# Patient Record
Sex: Female | Born: 1945 | Race: Black or African American | Hispanic: No | Marital: Married | Smoking: Current every day smoker
Health system: Southern US, Community
[De-identification: ages and names within clinical notes are randomized; demographics above are authoritative.]

## PROBLEM LIST (undated history)

## (undated) DIAGNOSIS — C50919 Malignant neoplasm of unspecified site of unspecified female breast: Secondary | ICD-10-CM

## (undated) DIAGNOSIS — R569 Unspecified convulsions: Secondary | ICD-10-CM

## (undated) DIAGNOSIS — E7800 Pure hypercholesterolemia, unspecified: Secondary | ICD-10-CM

## (undated) DIAGNOSIS — E119 Type 2 diabetes mellitus without complications: Secondary | ICD-10-CM

## (undated) DIAGNOSIS — I10 Essential (primary) hypertension: Secondary | ICD-10-CM

## (undated) HISTORY — PX: BACK SURGERY: SHX140

## (undated) HISTORY — PX: CHOLECYSTECTOMY: SHX55

---

## 2016-07-27 ENCOUNTER — Emergency Department (HOSPITAL_BASED_OUTPATIENT_CLINIC_OR_DEPARTMENT_OTHER): Payer: Medicare Other

## 2016-07-27 ENCOUNTER — Encounter (HOSPITAL_BASED_OUTPATIENT_CLINIC_OR_DEPARTMENT_OTHER): Payer: Self-pay | Admitting: Adult Health

## 2016-07-27 ENCOUNTER — Emergency Department (HOSPITAL_BASED_OUTPATIENT_CLINIC_OR_DEPARTMENT_OTHER)
Admission: EM | Admit: 2016-07-27 | Discharge: 2016-07-27 | Disposition: A | Discharge status: Home / Self Care | Payer: Medicare Other | Attending: Emergency Medicine | Admitting: Emergency Medicine

## 2016-07-27 DIAGNOSIS — Z79899 Other long term (current) drug therapy: Secondary | ICD-10-CM | POA: Diagnosis not present

## 2016-07-27 DIAGNOSIS — Z91040 Latex allergy status: Secondary | ICD-10-CM | POA: Insufficient documentation

## 2016-07-27 DIAGNOSIS — I10 Essential (primary) hypertension: Secondary | ICD-10-CM | POA: Insufficient documentation

## 2016-07-27 DIAGNOSIS — F17200 Nicotine dependence, unspecified, uncomplicated: Secondary | ICD-10-CM | POA: Insufficient documentation

## 2016-07-27 DIAGNOSIS — R55 Syncope and collapse: Secondary | ICD-10-CM

## 2016-07-27 DIAGNOSIS — E876 Hypokalemia: Secondary | ICD-10-CM

## 2016-07-27 DIAGNOSIS — Z853 Personal history of malignant neoplasm of breast: Secondary | ICD-10-CM | POA: Diagnosis not present

## 2016-07-27 HISTORY — DX: Malignant neoplasm of unspecified site of unspecified female breast: C50.919

## 2016-07-27 HISTORY — DX: Essential (primary) hypertension: I10

## 2016-07-27 LAB — CBC
HEMATOCRIT: 37.1 % (ref 36.0–46.0)
Hemoglobin: 12.4 g/dL (ref 12.0–15.0)
MCH: 28.5 pg (ref 26.0–34.0)
MCHC: 33.4 g/dL (ref 30.0–36.0)
MCV: 85.3 fL (ref 78.0–100.0)
PLATELETS: 174 10*3/uL (ref 150–400)
RBC: 4.35 MIL/uL (ref 3.87–5.11)
RDW: 13.4 % (ref 11.5–15.5)
WBC: 5.8 10*3/uL (ref 4.0–10.5)

## 2016-07-27 LAB — BASIC METABOLIC PANEL
Anion gap: 9 (ref 5–15)
BUN: 12 mg/dL (ref 6–20)
CALCIUM: 9.4 mg/dL (ref 8.9–10.3)
CO2: 27 mmol/L (ref 22–32)
CREATININE: 1.04 mg/dL — AB (ref 0.44–1.00)
Chloride: 103 mmol/L (ref 101–111)
GFR calc Af Amer: >60 mL/min (ref >60)
GFR, EST NON AFRICAN AMERICAN: 54 mL/min — AB (ref >60)
Glucose, Bld: 125 mg/dL — ABNORMAL HIGH (ref 65–99)
POTASSIUM: 2.8 mmol/L — AB (ref 3.5–5.1)
SODIUM: 139 mmol/L (ref 135–145)

## 2016-07-27 LAB — CBG MONITORING, ED: GLUCOSE-CAPILLARY: 134 mg/dL — AB (ref 65–99)

## 2016-07-27 MED ORDER — ACETAMINOPHEN 325 MG PO TABS
650.0000 mg | ORAL_TABLET | Freq: Once | ORAL | Status: AC
  Administered 2016-07-27: 650 mg via ORAL
  Filled 2016-07-27: qty 2

## 2016-07-27 MED ORDER — IBUPROFEN 400 MG PO TABS
400.0000 mg | ORAL_TABLET | Freq: Once | ORAL | Status: AC
  Administered 2016-07-27: 400 mg via ORAL
  Filled 2016-07-27: qty 1

## 2016-07-27 MED ORDER — SODIUM CHLORIDE 0.9 % IV BOLUS (SEPSIS)
1000.0000 mL | Freq: Once | INTRAVENOUS | Status: AC
  Administered 2016-07-27: 1000 mL via INTRAVENOUS

## 2016-07-27 MED ORDER — POTASSIUM CHLORIDE CRYS ER 20 MEQ PO TBCR
40.0000 meq | EXTENDED_RELEASE_TABLET | Freq: Once | ORAL | Status: AC
  Administered 2016-07-27: 40 meq via ORAL
  Filled 2016-07-27: qty 2

## 2016-07-27 NOTE — ED Notes (Signed)
Patient transported to CT 

## 2016-07-27 NOTE — ED Notes (Signed)
Patient denies pain and is resting comfortably.  

## 2016-07-27 NOTE — Discharge Instructions (Signed)
Please read and follow all provided instructions.  Your diagnoses today include:  1. Near syncope   2. Hypokalemia    Tests performed today include: Vital signs. See below for your results today.   Medications prescribed:  Take as prescribed   Home care instructions:  Follow any educational materials contained in this packet.  Follow-up instructions: Please follow-up with your primary care provider for further evaluation of symptoms and treatment   Return instructions:  Please return to the Emergency Department if you do not get better, if you get worse, or new symptoms OR  - Fever (temperature greater than 101.20F)  - Bleeding that does not stop with holding pressure to the area    -Severe pain (please note that you may be more sore the day after your accident)  - Chest Pain  - Difficulty breathing  - Severe nausea or vomiting  - Inability to tolerate food and liquids  - Passing out  - Skin becoming red around your wounds  - Change in mental status (confusion or lethargy)  - New numbness or weakness    Please return if you have any other emergent concerns.  Additional Information:  Your vital signs today were: BP 137/60    Pulse 69    Temp 98.6 F (37 C) (Oral)    Resp 17    Ht 5\' 4"  (1.626 m)    Wt 72.6 kg    LMP  (LMP Unknown)    SpO2 97%    BMI 27.46 kg/m  If your blood pressure (BP) was elevated above 135/85 this visit, please have this repeated by your doctor within one month. ---------------

## 2016-07-27 NOTE — ED Notes (Signed)
Pt given d/c instructions as per chart. Verbalizes understanding. No questions. 

## 2016-07-27 NOTE — ED Triage Notes (Signed)
Presents with one week of headache that has been constant. Today while getting ready to cook a meal she states, "I became reall dizzy and not feeling good, not feeling right. My mouth felt numb and funny and felt like I was going to pass out. I felt my BP was low, I went and laid down and checked my BP it was 94/49. I take 6 BP medications a day and I don't think I should be taking the 100 of Atenolol in the morning because the drug store said the doctor took me off it, but I still have refills and they are still giving it to me. I took the 50 mg of Atenolol this AM and then after it happened I took the other 50 mg thinking it was high BP." Denies numbness and tingling at this time, denies trouble speaking, denies one sided weakness or numbness. Everything is resolved at this time. Pt alert. Denies pain. BP now 160/65.

## 2016-07-27 NOTE — ED Provider Notes (Signed)
[facility] Provider Note   CSN: [id number] Arrival date & time: 07/27/16  1315     History   Chief Complaint Chief Complaint  Patient presents with  . Near Syncope    HPI Carolyn Jimenez is a 70 y.o. female.  HPI  70 y.o. female with a hx of HTN, presents to the Emergency Department today due to x 1 week headache that has been constant and unilateral. Pt does note that the headache went away last night and states that this usually occurs. What brought her in today was when she had a near syncopal episode while in the kitchen. Pt states that she was standing and cooking when she felt dizzy and funny. Notes numbness around lips. Pt proceeded to check her BP and noticed that it was 94/49. Pt notes that she takes 6 BP meds a day from PCP. Pt notes that a pharmacist told her that she is not supposed to be taking 100mg  Atenolol and that her PCP switched her to 50mg . Pt unaware of this change. During episode today, pt denied CP/SOB. NO visual changes. No N/V/D. No numbness/tingling. No fevers. Notes no symptoms currently aside from mild headache. No other symptoms noted   Past Medical History:  Diagnosis Date  . Breast cancer ([place])   . Hypertension     There are no active problems to display for this patient.   History reviewed. No pertinent surgical history.  OB History    No data available       Home Medications    Prior to Admission medications   Medication Sig Start Date End Date Taking? Authorizing Provider  amLODipine (NORVASC) 10 MG tablet Take 10 mg by mouth daily.   Yes Historical Provider, MD  atenolol (TENORMIN) 100 MG tablet Take 100 mg by mouth daily.   Yes Historical Provider, MD  atenolol (TENORMIN) 50 MG tablet Take 50 mg by mouth daily.   Yes Historical Provider, MD  benazepril (LOTENSIN) 40 MG tablet Take 40 mg by mouth daily.   Yes Historical Provider, MD  doxazosin (CARDURA) 2 MG tablet Take 2 mg by mouth daily.   Yes Historical Provider, MD    gabapentin (NEURONTIN) 300 MG capsule Take 300 mg by mouth at bedtime.   Yes Historical Provider, MD  omeprazole (PRILOSEC) 40 MG capsule Take 40 mg by mouth daily.   Yes Historical Provider, MD  oxyCODONE-acetaminophen (PERCOCET) 10-325 MG tablet Take 1 tablet by mouth every 4 (four) hours as needed for pain.   Yes Historical Provider, MD  pravastatin (PRAVACHOL) 40 MG tablet Take 40 mg by mouth daily.   Yes Historical Provider, MD  tiZANidine (ZANAFLEX) 4 MG capsule Take 4 mg by mouth 3 (three) times daily.   Yes Historical Provider, MD    Family History History reviewed. No pertinent family history.  Social History Social History  Substance Use Topics  . Smoking status: Current Every Day Smoker  . Smokeless tobacco: Never Used  . Alcohol use No     Allergies   Codeine; Latex; and Sulfa antibiotics   Review of Systems Review of Systems ROS reviewed and all are negative for acute change except as noted in the HPI.  Physical Exam Updated Vital Signs BP 142/62   Pulse 70   Temp 98.6 F (37 C) (Oral)   Resp 14   Ht 5\' 4"  (1.626 m)   Wt 72.6 kg   LMP  (LMP Unknown)   SpO2 96%   BMI 27.46 kg/m  Physical Exam  Constitutional: She is oriented to person, place, and time. Vital signs are normal. She appears well-developed and well-nourished.  HENT:  Head: Normocephalic and atraumatic.  Right Ear: Hearing normal.  Left Ear: Hearing normal.  Eyes: Conjunctivae and EOM are normal. Pupils are equal, round, and reactive to light.  Neck: Normal range of motion. Neck supple.  Cardiovascular: Normal rate, regular rhythm, normal heart sounds and intact distal pulses.   Pulmonary/Chest: Effort normal and breath sounds normal.  Abdominal: Soft.  Musculoskeletal: Normal range of motion.  Neurological: She is alert and oriented to person, place, and time. She has normal strength. No cranial nerve deficit or sensory deficit.  Cranial Nerves:  II: Pupils equal, round, reactive to  light III,IV, VI: ptosis not present, extra-ocular motions intact bilaterally  V,VII: smile symmetric, facial light touch sensation equal VIII: hearing grossly normal bilaterally  IX,X: midline uvula rise  XI: bilateral shoulder shrug equal and strong XII: midline tongue extension Finger to Nose Exam Unremarkable   Skin: Skin is warm and dry.  Psychiatric: She has a normal mood and affect. Her speech is normal and behavior is normal. Thought content normal.  Nursing note and vitals reviewed.  ED Treatments / Results  Labs (all labs ordered are listed, but only abnormal results are displayed) Labs Reviewed  BASIC METABOLIC PANEL - Abnormal; Notable for the following:       Result Value   Potassium 2.8 (*)    Glucose, Bld 125 (*)    Creatinine, Ser 1.04 (*)    GFR calc non Af Amer 54 (*)    All other components within normal limits  CBG MONITORING, ED - Abnormal; Notable for the following:    Glucose-Capillary 134 (*)    All other components within normal limits  CBC  URINALYSIS, ROUTINE W REFLEX MICROSCOPIC (NOT AT [facility])   EKG  EKG Interpretation None      Radiology Ct Head Wo Contrast  Result Date: 07/27/2016 CLINICAL DATA:  Headache for 9 days. EXAM: CT HEAD WITHOUT CONTRAST TECHNIQUE: Contiguous axial images were obtained from the base of the skull through the vertex without intravenous contrast. COMPARISON:  None. FINDINGS: Brain: Ventricles are normal in size and configuration. All areas of the brain demonstrate normal gray-white matter attenuation. There is no mass, hemorrhage, edema or other evidence of acute parenchymal abnormality. No extra-axial hemorrhage. Vascular: There are chronic calcified atherosclerotic changes of the large vessels at the skull base. No unexpected hyperdense vessel. Skull: Normal. Negative for fracture or focal lesion. Sinuses/Orbits: No acute finding. Visualized portions of the upper paranasal sinuses are clear. Mastoid air cells are clear.  Other: None. IMPRESSION: No acute findings.  No intracranial mass, hemorrhage or edema. Electronically Signed   By: Franki Cabot M.D.   On: 07/27/2016 15:26    Procedures Procedures (including critical care time)  Medications Ordered in ED Medications  sodium chloride 0.9 % bolus 1,000 mL (1,000 mLs Intravenous New Bag/Given 07/27/16 1448)  acetaminophen (TYLENOL) tablet 650 mg (650 mg Oral Given 07/27/16 1526)  ibuprofen (ADVIL,MOTRIN) tablet 400 mg (400 mg Oral Given 07/27/16 1526)  potassium chloride SA (K-DUR,KLOR-CON) CR tablet 40 mEq (40 mEq Oral Given 07/27/16 1547)   Initial Impression / Assessment and Plan / ED Course  I have reviewed the triage vital signs and the nursing notes.  Pertinent labs & imaging results that were available during my care of the patient were reviewed by me and considered in my medical decision making (see chart  for details).  Clinical Course    Final Clinical Impressions(s) / ED Diagnoses  {I have reviewed and evaluated the relevant laboratory values. {I have reviewed and evaluated the relevant imaging studies. {I have interpreted the relevant EKG. {I have reviewed the relevant previous healthcare records.  {I obtained HPI from historian.   ED Course:  Assessment: Pt is a 70yF who presents with near syncopal episode this morning. Notes headache in the past week that resolved yesterday. States episode lasted a few minutes and resolved. No head trauma or LOC. No N/V. No CP/SOB. No fevers. Pt does not that her pharmacist informed her that she was supposed to be taking 50mg  Atenolo instead of 100mg . Pt BP this morning was 94 systolic when check after near syncopal episode.. On exam, pt in NAD. Nontoxic/nonseptic appearing. VSS. Afebrile. Lungs CTA. Heart RRR. Abdomen nontender soft. CN evaluated and unremarkable. No symptoms currently. CBC/BMP with mild hypokalemia that was replaced in ED. CT Head unremarkable. EKG unremarkable. Given fluids in ED. Likely  attributed to Atenolol that pt was taking. Counseled patient to take 50mg  Atenolol instead of 100mg  and follow up to PCP for further management. Plan is to DC home with follow up to PCP. Pt agrees with plan. Counseled on recording BP throughout the day for PCP. At time of discharge, Patient is in no acute distress. Vital Signs are stable. Patient is able to ambulate. Patient able to tolerate PO.   Disposition/Plan:  DC Home Additional Verbal discharge instructions given and discussed with patient.  Pt Instructed to f/u with PCP in the next week for evaluation and treatment of symptoms. Return precautions given Pt acknowledges and agrees with plan  Supervising Physician Julianne Rice, MD  Final diagnoses:  Near syncope  Hypokalemia    New Prescriptions New Prescriptions   No medications on file     Shary Decamp, PA-C 07/27/16 1606    Julianne Rice, MD 07/28/16 1402

## 2016-08-08 ENCOUNTER — Emergency Department (HOSPITAL_BASED_OUTPATIENT_CLINIC_OR_DEPARTMENT_OTHER)
Admission: EM | Admit: 2016-08-08 | Discharge: 2016-08-08 | Disposition: A | Discharge status: Home / Self Care | Payer: Medicare Other | Attending: Dermatology | Admitting: Dermatology

## 2016-08-08 ENCOUNTER — Encounter (HOSPITAL_BASED_OUTPATIENT_CLINIC_OR_DEPARTMENT_OTHER): Payer: Self-pay

## 2016-08-08 DIAGNOSIS — Z853 Personal history of malignant neoplasm of breast: Secondary | ICD-10-CM | POA: Diagnosis not present

## 2016-08-08 DIAGNOSIS — F17210 Nicotine dependence, cigarettes, uncomplicated: Secondary | ICD-10-CM | POA: Diagnosis not present

## 2016-08-08 DIAGNOSIS — Z79899 Other long term (current) drug therapy: Secondary | ICD-10-CM | POA: Insufficient documentation

## 2016-08-08 DIAGNOSIS — Z5321 Procedure and treatment not carried out due to patient leaving prior to being seen by health care provider: Secondary | ICD-10-CM | POA: Diagnosis not present

## 2016-08-08 DIAGNOSIS — I959 Hypotension, unspecified: Secondary | ICD-10-CM | POA: Diagnosis not present

## 2016-08-08 NOTE — ED Triage Notes (Signed)
Nurse first-pt seated with family in ED WR-NAD

## 2016-08-08 NOTE — ED Triage Notes (Signed)
Pt not in ED WR 

## 2016-08-08 NOTE — ED Triage Notes (Signed)
Pt c/o cont'd low blood pressure today-seen here for same 11/25-last BP 74/50 at aprox 30 min PTA-NAD-steady gait

## 2018-01-07 ENCOUNTER — Encounter (HOSPITAL_BASED_OUTPATIENT_CLINIC_OR_DEPARTMENT_OTHER): Payer: Self-pay

## 2018-01-07 ENCOUNTER — Other Ambulatory Visit: Payer: Self-pay

## 2018-01-07 ENCOUNTER — Emergency Department (HOSPITAL_BASED_OUTPATIENT_CLINIC_OR_DEPARTMENT_OTHER): Payer: Medicare Other

## 2018-01-07 DIAGNOSIS — Z79899 Other long term (current) drug therapy: Secondary | ICD-10-CM | POA: Insufficient documentation

## 2018-01-07 DIAGNOSIS — Z91040 Latex allergy status: Secondary | ICD-10-CM | POA: Diagnosis not present

## 2018-01-07 DIAGNOSIS — E119 Type 2 diabetes mellitus without complications: Secondary | ICD-10-CM | POA: Insufficient documentation

## 2018-01-07 DIAGNOSIS — I10 Essential (primary) hypertension: Secondary | ICD-10-CM | POA: Insufficient documentation

## 2018-01-07 DIAGNOSIS — I2510 Atherosclerotic heart disease of native coronary artery without angina pectoris: Secondary | ICD-10-CM | POA: Diagnosis not present

## 2018-01-07 DIAGNOSIS — R0789 Other chest pain: Secondary | ICD-10-CM | POA: Diagnosis not present

## 2018-01-07 DIAGNOSIS — R079 Chest pain, unspecified: Secondary | ICD-10-CM | POA: Diagnosis present

## 2018-01-07 DIAGNOSIS — Z853 Personal history of malignant neoplasm of breast: Secondary | ICD-10-CM | POA: Insufficient documentation

## 2018-01-07 DIAGNOSIS — F17210 Nicotine dependence, cigarettes, uncomplicated: Secondary | ICD-10-CM | POA: Diagnosis not present

## 2018-01-07 DIAGNOSIS — Z7982 Long term (current) use of aspirin: Secondary | ICD-10-CM | POA: Diagnosis not present

## 2018-01-07 LAB — CBC
HCT: 33.1 % — ABNORMAL LOW (ref 36.0–46.0)
Hemoglobin: 11.2 g/dL — ABNORMAL LOW (ref 12.0–15.0)
MCH: 27.7 pg (ref 26.0–34.0)
MCHC: 33.8 g/dL (ref 30.0–36.0)
MCV: 81.7 fL (ref 78.0–100.0)
Platelets: 177 10*3/uL (ref 150–400)
RBC: 4.05 MIL/uL (ref 3.87–5.11)
RDW: 12.3 % (ref 11.5–15.5)
WBC: 6 10*3/uL (ref 4.0–10.5)

## 2018-01-07 LAB — BASIC METABOLIC PANEL
Anion gap: 13 (ref 5–15)
BUN: 17 mg/dL (ref 6–20)
CALCIUM: 9.1 mg/dL (ref 8.9–10.3)
CO2: 22 mmol/L (ref 22–32)
CREATININE: 1.11 mg/dL — AB (ref 0.44–1.00)
Chloride: 97 mmol/L — ABNORMAL LOW (ref 101–111)
GFR, EST AFRICAN AMERICAN: 57 mL/min — AB (ref >60)
GFR, EST NON AFRICAN AMERICAN: 49 mL/min — AB (ref >60)
Glucose, Bld: 112 mg/dL — ABNORMAL HIGH (ref 65–99)
Potassium: 3.8 mmol/L (ref 3.5–5.1)
SODIUM: 132 mmol/L — AB (ref 135–145)

## 2018-01-07 LAB — TROPONIN I

## 2018-01-07 NOTE — ED Triage Notes (Signed)
C/o CP x 20 min-NAD-steady gait

## 2018-01-08 ENCOUNTER — Emergency Department (HOSPITAL_BASED_OUTPATIENT_CLINIC_OR_DEPARTMENT_OTHER)
Admission: EM | Admit: 2018-01-08 | Discharge: 2018-01-08 | Disposition: A | Discharge status: Home / Self Care | Payer: Medicare Other | Attending: Emergency Medicine | Admitting: Emergency Medicine

## 2018-01-08 DIAGNOSIS — R0789 Other chest pain: Secondary | ICD-10-CM

## 2018-01-08 HISTORY — DX: Pure hypercholesterolemia, unspecified: E78.00

## 2018-01-08 HISTORY — DX: Unspecified convulsions: R56.9

## 2018-01-08 HISTORY — DX: Type 2 diabetes mellitus without complications: E11.9

## 2018-01-08 LAB — TROPONIN I

## 2018-01-08 MED ORDER — PANTOPRAZOLE SODIUM 40 MG PO TBEC
DELAYED_RELEASE_TABLET | ORAL | Status: AC
  Filled 2018-01-08: qty 1

## 2018-01-08 MED ORDER — GI COCKTAIL ~~LOC~~
30.0000 mL | Freq: Once | ORAL | Status: AC
  Administered 2018-01-08: 30 mL via ORAL
  Filled 2018-01-08: qty 30

## 2018-01-08 NOTE — Discharge Instructions (Signed)
Please avoid NSAIDs such as aspirin (Goody powders), ibuprofen (Motrin, Advil), naproxen (Aleve) as these may worsen your symptoms.  Tylenol 1000 mg every 6 hours is safe to take as long as you have no history of liver problems (heavy alcohol use, cirrhosis, hepatitis).  Please avoid spicy, acidic (citrus fruits, tomato based sauces, salsa), greasy, fatty foods.  Please avoid caffeine and alcohol.     Please continue your Protonix 40 mg twice a day as prescribed.  You may use over-the-counter Maalox as needed for indigestion, heartburn.

## 2018-01-08 NOTE — ED Provider Notes (Signed)
TIME SEEN: 1:06 AM  CHIEF COMPLAINT: Chest pain  HPI: Patient is a 72 year old female with history of hypertension, hyperlipidemia, borderline diabetes, seizures, breast cancer in remission who presents to the emergency department with chest pain.  Patient reports that yesterday afternoon she had a headache and then around 9:30 PM started "feeling real bad".  States she felt clammy, cold and had a choking feeling in her chest.  States she did feel short of breath and lightheaded briefly but this has resolved.  No abdominal pain, nausea or vomiting, diaphoresis.  No fever.  States that she has felt like her heart rate was elevated but did not measure it.  It did not feel irregular.  Recently had a cardiac catheterization in March which she reports was normal.  No history of cardiac stents.  No history of PE or DVT.  No lower extremity swelling or pain.  No documented fevers that she is aware of.  No productive cough.   CTA Chest at [facility] 11/18/17:  CLINICAL DATA:Back and chest pain with elevated D-dimer.  EXAM: CT ANGIOGRAPHY CHEST WITH CONTRAST  TECHNIQUE: Multidetector CT imaging of the chest was performed using the standard protocol during bolus administration of intravenous contrast. Multiplanar CT image reconstructions and MIPs were obtained to evaluate the vascular anatomy.  CONTRAST:100 mL Omnipaque 350  COMPARISON:Chest radiograph 10/25/2017  FINDINGS: Cardiovascular: Contrast injection is sufficient to demonstrate satisfactory opacification of the pulmonary arteries to the segmental level. There is no pulmonary embolus. The main pulmonary artery is within normal limits for size. There is a normal 3-vessel arch branching pattern without evidence of acute aortic syndrome. There is mildaortic atherosclerosis. Heart size is normal, without pericardial effusion.  Mediastinum/Nodes: No mediastinal, hilar or axillary lymphadenopathy. The visualized thyroid and thoracic  esophageal course are unremarkable.  Lungs/Pleura: No pulmonary nodules or masses. No pleural effusion or pneumothorax. No focal airspace consolidation. No focal pleural abnormality.  Upper Abdomen: Contrast bolus timing is not optimized for evaluation of the abdominal organs. Within this limitation, the visualized organs of the upper abdomen are normal.  Musculoskeletal: No chest wall abnormality. No acute or significant osseous findings.  Review of the MIP images confirms the above findings.  IMPRESSION: 1. No pulmonary embolus. 2. No acute abnormality of the chest. 3.Aortic Atherosclerosis (ICD10-I70.0).   Electronically Signed By: Derinda Sis M.D. On: 11/18/2017 21:45  Cath 11/24/17 at [facility]:  Cardiac Diagnostic Report  Demographics  [place] of04/27/1947Height  Name Birth  [id number] Age71 year(s)Weight  Number  [id number] Gender FemaleBSA  Number  [id number] Race Unknown BMI  Number  Room [facility] Date of Study03/25/2019  Number  ReferringDiagnostic Reymundo Poll, Physician   MD, [facility]  Procedure  Procedure Type  Diagnostic procedure: Left Heart Cath , Selective Coronaries, Left  Ventricular Injection, CARDIAC CATH  Complications: No Complications.  Conclusions  Diagnostic Procedure Summary  Normal coronary arteries.  Normal LV function LV ejection fraction is 65-70 %  Diagnostic Procedure Recommendations  Recommend work up for non-cardiac causes of symptoms.  Smoking cessation.  I have reviewed the recent history and physical documentation. I personally  spent 8 minutes continuously monitoring the patient during  the administration  of moderate sedation. Pre and post activities have been reviewed. I was  present for the entire procedure.  Signatures  Electronically signed by Clarene Critchley, MD, FACC(Diagnostic  Physician) on 11/24/2017 12:12  Angiographic findings  Cardiac Arteries and Lesion Findings  LMCA: Normal appearance with 0% stenosis.  LAD: Normal appearance with 0% stenosis.  LCx:  Normal appearance with 0% stenosis.  RCA: Normal appearance with 0% stenosis.   ROS: See HPI Constitutional: no fever  Eyes: no drainage  ENT: no runny nose   Cardiovascular:   chest pain  Resp:  SOB  GI: no vomiting GU: no dysuria Integumentary: no rash  Allergy: no hives  Musculoskeletal: no leg swelling  Neurological: no slurred speech ROS otherwise negative  PAST MEDICAL HISTORY/PAST SURGICAL HISTORY:  Past Medical History:  Diagnosis Date  . Breast cancer ([place])   . Diabetes mellitus [place] complication ([place])   . High cholesterol   . Hypertension   . Seizures ([place])     MEDICATIONS:  Prior to Admission medications   Medication Sig Start Date End Date Taking? Authorizing Provider  amLODipine (NORVASC) 10 MG tablet Take 10 mg by mouth daily.   Yes [provider]  aspirin 81 MG chewable tablet Chew by mouth daily.   Yes [provider]  atenolol (TENORMIN) 50 MG tablet Take 50 mg by mouth daily.   Yes [provider]  benazepril (LOTENSIN) 40 MG tablet Take 40 mg by mouth daily.   Yes [provider]  gabapentin (NEURONTIN) 300 MG capsule Take 300 mg by mouth 3 (three) times daily.    Yes [provider]  levETIRAcetam (KEPPRA) 500 MG tablet Take 500 mg by mouth 2 (two) times daily.   Yes [provider]  metoprolol succinate (TOPROL-XL) 25 MG 24 hr tablet Take 25 mg by mouth daily.   Yes [provider]  pantoprazole (PROTONIX) 40 MG tablet Take 40 mg by mouth 2 (two) times daily.   Yes [provider]   rosuvastatin (CRESTOR) 20 MG tablet Take 20 mg by mouth daily.   Yes [provider]  sertraline (ZOLOFT) 50 MG tablet Take 50 mg by mouth 2 (two) times daily. 1 tablet in the morning and 1/2 tablet at night   Yes [provider]  triamterene-hydrochlorothiazide (DYAZIDE) 37.5-25 MG capsule Take 1 capsule by mouth daily.   Yes [provider]  doxazosin (CARDURA) 2 MG tablet Take 2 mg by mouth daily.    [provider]  omeprazole (PRILOSEC) 40 MG capsule Take 40 mg by mouth daily.    [provider]  oxyCODONE-acetaminophen (PERCOCET) 10-325 MG tablet Take 1 tablet by mouth every 4 (four) hours as needed for pain.    [provider]  pravastatin (PRAVACHOL) 40 MG tablet Take 40 mg by mouth daily.    [provider]    ALLERGIES:  Allergies  Allergen Reactions  . Codeine   . Latex   . Sulfa Antibiotics     SOCIAL HISTORY:  Social History   Tobacco Use  . Smoking status: Current Every Day Smoker    Types: Cigarettes  . Smokeless tobacco: Never Used  Substance Use Topics  . Alcohol use: No    FAMILY HISTORY: No family history on file.  EXAM: BP (!) 164/70 (BP Location: Left Arm)   Pulse (!) 103   Temp 98.6 F (37 C) (Oral)   Resp 20   Ht 5\' 4"  (1.626 m)   Wt 70.3 kg (155 lb)   LMP  (LMP Unknown)   SpO2 100%   BMI 26.61 kg/m  CONSTITUTIONAL: Alert and oriented and responds appropriately to questions. Well-appearing; well-nourished HEAD: Normocephalic EYES: Conjunctivae clear, pupils appear equal, EOMI ENT: normal nose; moist mucous membranes NECK: Supple, no meningismus, no nuchal rigidity, no LAD  CARD: RRR; S1 and S2 appreciated;  no murmurs, no clicks, no rubs, no gallops RESP: Normal chest excursion without splinting or tachypnea; breath sounds clear and equal bilaterally; no wheezes, no rhonchi, no rales, no hypoxia or respiratory distress, speaking full sentences ABD/GI: Normal bowel sounds;  non-distended; soft, non-tender, no rebound, no guarding, no peritoneal signs, no hepatosplenomegaly BACK:  The back appears normal and is non-tender to palpation, there is no CVA tenderness EXT: Normal ROM in all joints; non-tender to palpation; no edema; normal capillary refill; no cyanosis, no calf tenderness or swelling    SKIN: Normal color for age and race; warm; no rash NEURO: Moves all extremities equally PSYCH: The patient's mood and manner are appropriate. Grooming and personal hygiene are appropriate.  MEDICAL DECISION MAKING: Patient here with chest pain.  I have reviewed her records from [facility] and it appears that she had normal cardiac catheterization in March 2019.  Also had normal ejection fraction at that time and a CT angio that showed no sign of pulmonary embolus.  Reports similar symptoms today and feels like she is choking.  She states she wonders if this could be indigestion.  Labs obtained in triage thus far have been unremarkable including one negative troponin and she has a clear chest x-ray.  Her EKG shows no new ischemic abnormality.  Plan is to repeat second troponin given she does have risk factors for ACS although she had a completely clean catheterization recently.  I doubt that this is PE or dissection.  It could be indigestion.  Will give GI cocktail, Protonix.  ED PROGRESS: Patient reports significant relief in symptoms after GI cocktail and Protonix.  Suspect GERD, indigestion.  Second troponin is negative.  I feel she is safe to be discharged home and follow-up with her outpatient primary care provider.  Discussed return precautions.  Patient is comfortable with this plan.   At this time, I do not feel there is any life-threatening condition present. I have reviewed and discussed all results (EKG, imaging, lab, urine as appropriate) and exam findings with patient/family. I have reviewed nursing notes and appropriate previous records.  I feel the patient is  safe to be discharged home without further emergent workup and can continue workup as an outpatient as needed. Discussed usual and customary return precautions. Patient/family verbalize understanding and are comfortable with this plan.  Outpatient follow-up has been provided if needed. All questions have been answered.   EKG Interpretation  Date/Time:  Wednesday Jan 07 2018 23:03:52 EDT Ventricular Rate:  85 PR Interval:  136 QRS Duration: 80 QT Interval:  390 QTC Calculation: 464 R Axis:   27 Text Interpretation:  Normal sinus rhythm Normal ECG Confirmed by Teoman Giraud, Cyril Mourning [phone]) on 01/07/2018 11:11:32 PM         Leyani Gargus, Delice Bison, DO 01/08/18 [id number]

## 2018-03-25 IMAGING — CT CT HEAD W/O CM
3 series · 16 of 47 positions shown, 19 images · non-contrast
Comparison: None.

CLINICAL DATA: Headache for 9 days.

EXAM:
CT HEAD WITHOUT CONTRAST
TECHNIQUE: Contiguous axial images were obtained from the base of the skull
through the vertex without intravenous contrast.

[Series 2: head wo · axial · 0.39mm/px · z∈[+726,+850]mm · 10 of 30 slices shown, 13 images]
[im 3/30  brain]
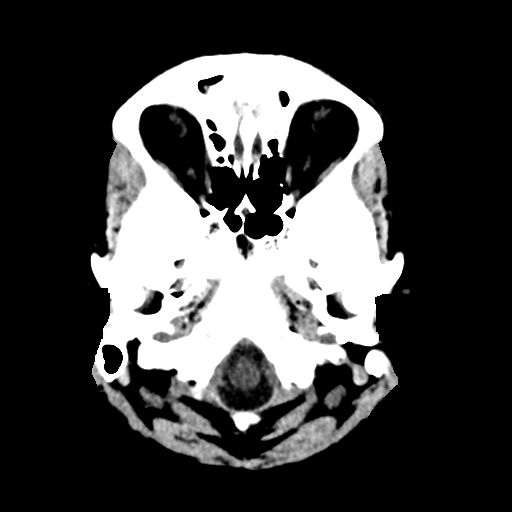
[im 3/30  bone]
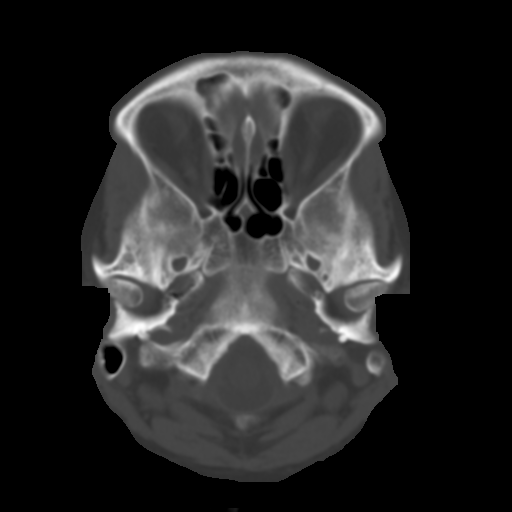
[im 6/30  brain]
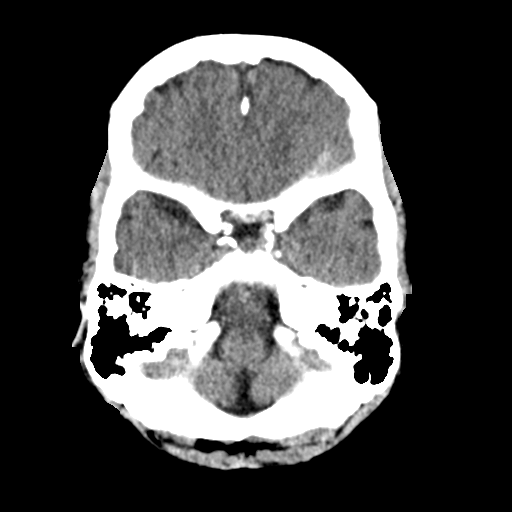
[im 9/30  brain]
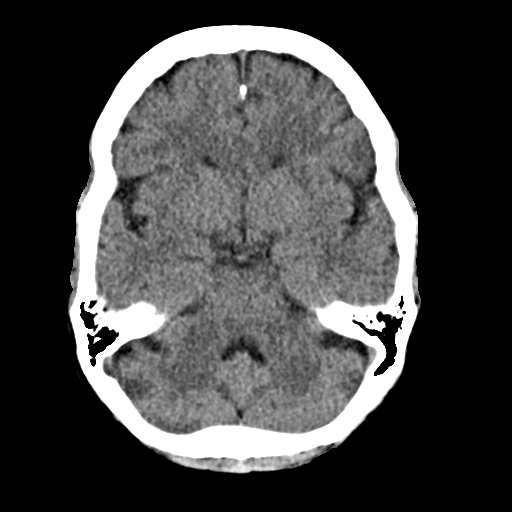
[im 11/30  brain]
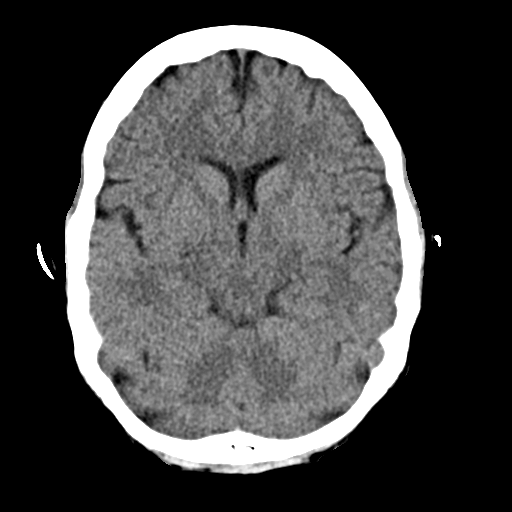
[im 14/30  brain]
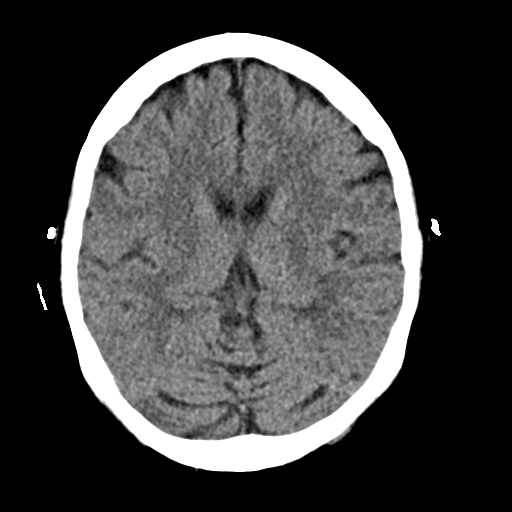
[im 14/30  bone]
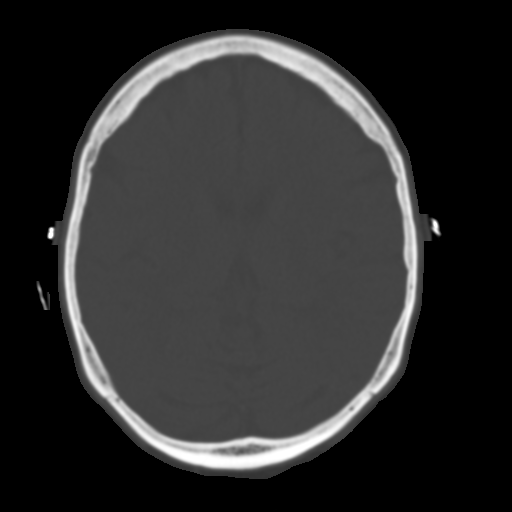
[im 17/30  brain]
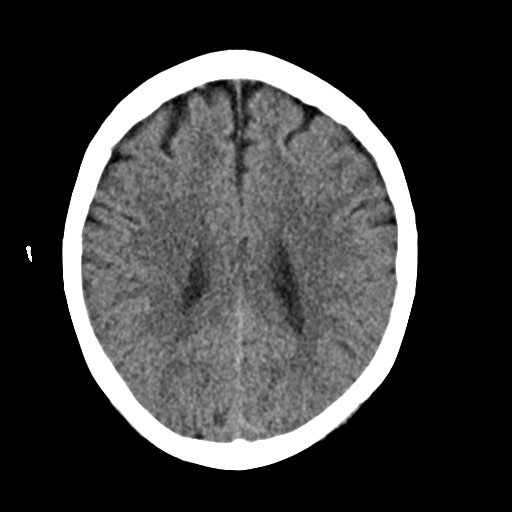
[im 20/30  brain]
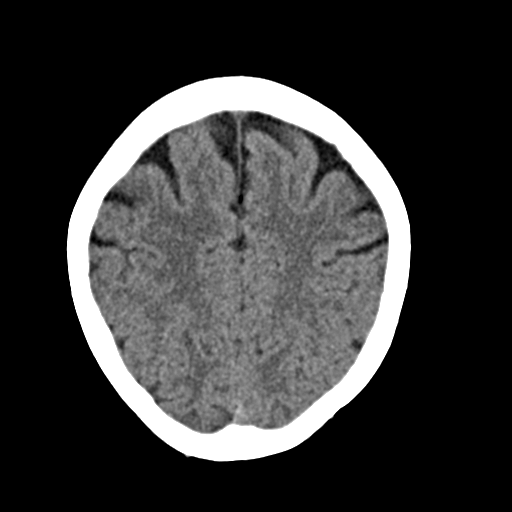
[im 23/30  brain]
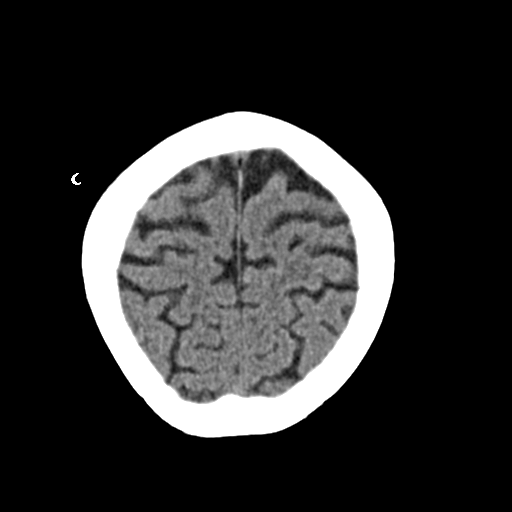
[im 25/30  brain]
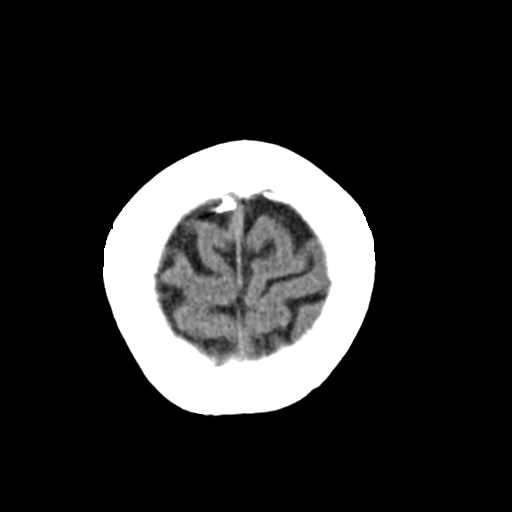
[im 25/30  bone]
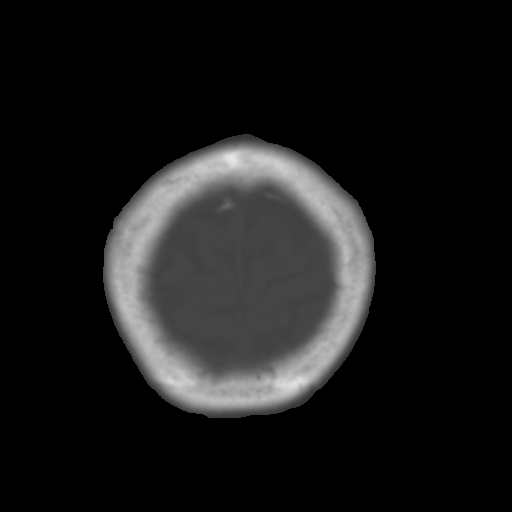
[im 28/30  brain]
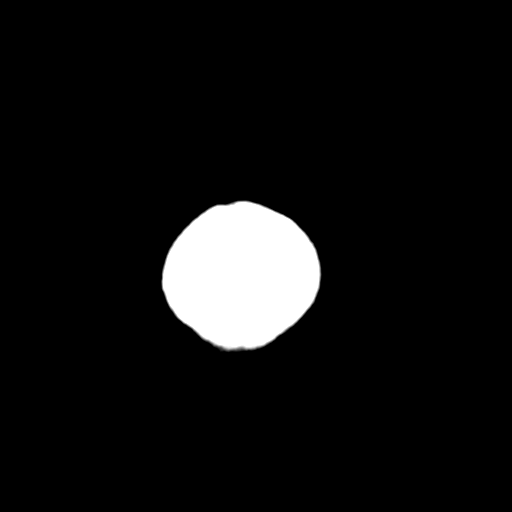

[Series 4: coronal soft · coronal · 0.30mm/px · 3 of 63 slices shown]
[im 21/63  brain]
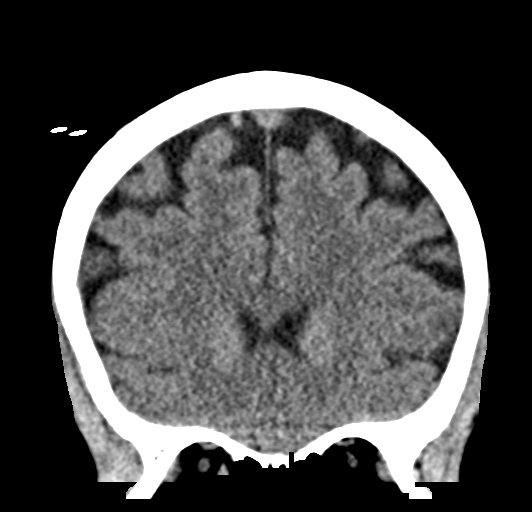
[im 28/63  brain]
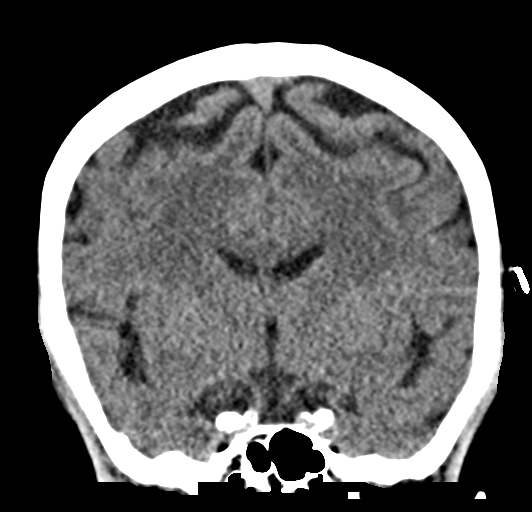
[im 35/63  brain]
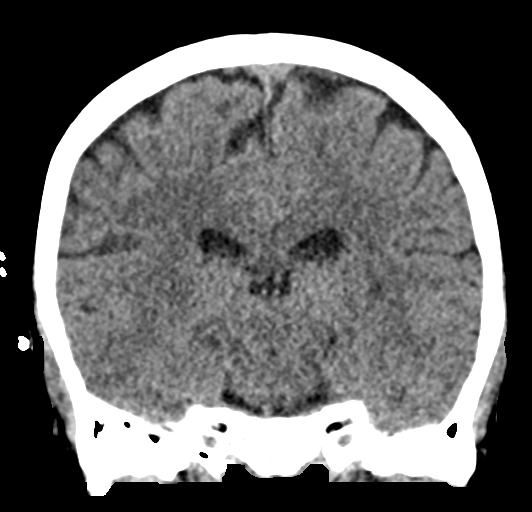

[Series 5: sag soft · sagittal · 0.29mm/px · 3 of 54 slices shown]
[im 18/54  brain]
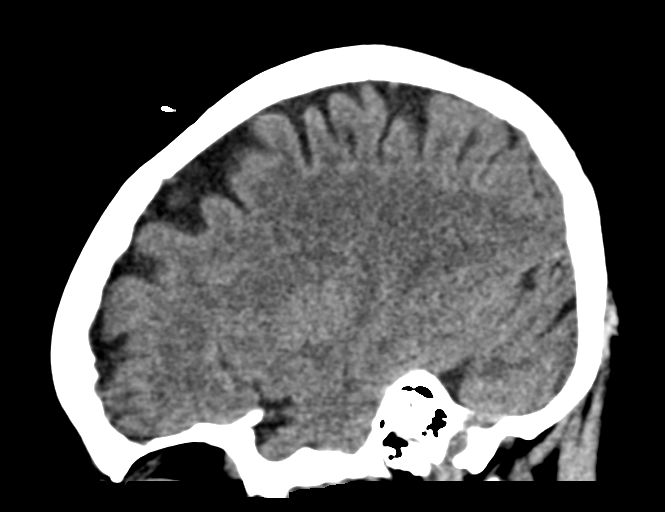
[im 27/54  brain]
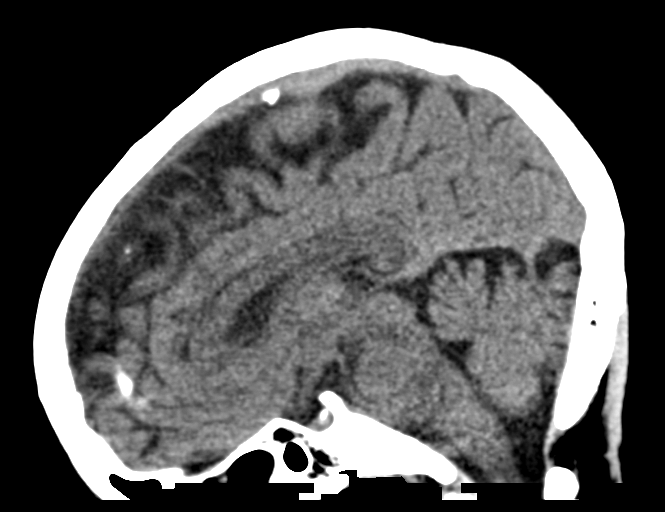
[im 36/54  brain]
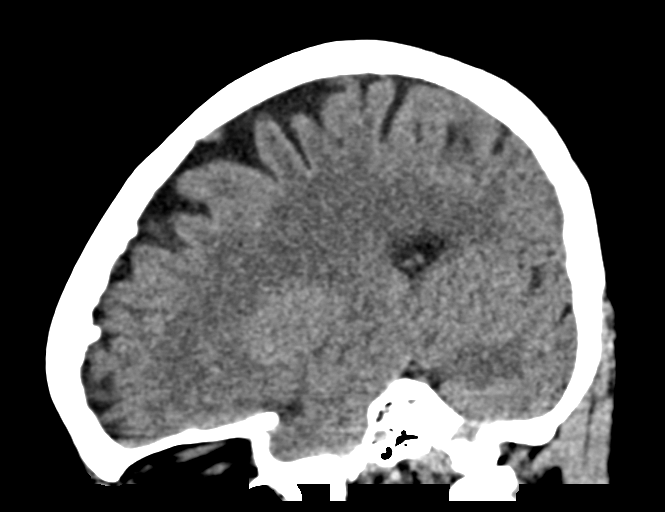

[16 of 47 positions shown; findings below may reference images not displayed]

FINDINGS: Brain: Ventricles are normal in size and configuration. All areas of
the brain demonstrate normal gray-white matter attenuation. There is
no mass, hemorrhage, edema or other evidence of acute parenchymal
abnormality. No extra-axial hemorrhage.

Vascular: There are chronic calcified atherosclerotic changes of the
large vessels at the skull base. No unexpected hyperdense vessel.

Skull: Normal. Negative for fracture or focal lesion.

Sinuses/Orbits: No acute finding. Visualized portions of the upper
paranasal sinuses are clear. Mastoid air cells are clear.

Other: None.
IMPRESSION: No acute findings.  No intracranial mass, hemorrhage or edema.

## 2018-07-11 ENCOUNTER — Emergency Department (HOSPITAL_BASED_OUTPATIENT_CLINIC_OR_DEPARTMENT_OTHER)
Admission: EM | Admit: 2018-07-11 | Discharge: 2018-07-11 | Disposition: A | Discharge status: Home / Self Care | Payer: Medicare Other | Attending: Emergency Medicine | Admitting: Emergency Medicine

## 2018-07-11 ENCOUNTER — Emergency Department (HOSPITAL_BASED_OUTPATIENT_CLINIC_OR_DEPARTMENT_OTHER): Payer: Medicare Other

## 2018-07-11 ENCOUNTER — Other Ambulatory Visit: Payer: Self-pay

## 2018-07-11 ENCOUNTER — Encounter (HOSPITAL_BASED_OUTPATIENT_CLINIC_OR_DEPARTMENT_OTHER): Payer: Self-pay | Admitting: Emergency Medicine

## 2018-07-11 DIAGNOSIS — F17210 Nicotine dependence, cigarettes, uncomplicated: Secondary | ICD-10-CM | POA: Insufficient documentation

## 2018-07-11 DIAGNOSIS — R42 Dizziness and giddiness: Secondary | ICD-10-CM

## 2018-07-11 DIAGNOSIS — R55 Syncope and collapse: Secondary | ICD-10-CM | POA: Insufficient documentation

## 2018-07-11 DIAGNOSIS — I10 Essential (primary) hypertension: Secondary | ICD-10-CM | POA: Diagnosis not present

## 2018-07-11 DIAGNOSIS — K5904 Chronic idiopathic constipation: Secondary | ICD-10-CM | POA: Diagnosis not present

## 2018-07-11 DIAGNOSIS — E119 Type 2 diabetes mellitus without complications: Secondary | ICD-10-CM | POA: Insufficient documentation

## 2018-07-11 DIAGNOSIS — Z79899 Other long term (current) drug therapy: Secondary | ICD-10-CM | POA: Diagnosis not present

## 2018-07-11 LAB — COMPREHENSIVE METABOLIC PANEL
ALK PHOS: 66 U/L (ref 38–126)
ALT: 10 U/L (ref 0–44)
AST: 18 U/L (ref 15–41)
Albumin: 4 g/dL (ref 3.5–5.0)
Anion gap: 13 (ref 5–15)
BUN: 22 mg/dL (ref 8–23)
CALCIUM: 9.7 mg/dL (ref 8.9–10.3)
CO2: 25 mmol/L (ref 22–32)
CREATININE: 1.2 mg/dL — AB (ref 0.44–1.00)
Chloride: 99 mmol/L (ref 98–111)
GFR, EST AFRICAN AMERICAN: 51 mL/min — AB (ref >60)
GFR, EST NON AFRICAN AMERICAN: 44 mL/min — AB (ref >60)
Glucose, Bld: 149 mg/dL — ABNORMAL HIGH (ref 70–99)
Potassium: 3.3 mmol/L — ABNORMAL LOW (ref 3.5–5.1)
Sodium: 137 mmol/L (ref 135–145)
Total Bilirubin: 0.6 mg/dL (ref 0.3–1.2)
Total Protein: 7.4 g/dL (ref 6.5–8.1)

## 2018-07-11 LAB — URINALYSIS, ROUTINE W REFLEX MICROSCOPIC
Bilirubin Urine: NEGATIVE
GLUCOSE, UA: NEGATIVE mg/dL
HGB URINE DIPSTICK: NEGATIVE
Ketones, ur: NEGATIVE mg/dL
Leukocytes, UA: NEGATIVE
Nitrite: NEGATIVE
Protein, ur: NEGATIVE mg/dL
pH: 5.5 (ref 5.0–8.0)

## 2018-07-11 LAB — CBC
HCT: 32.6 % — ABNORMAL LOW (ref 36.0–46.0)
HEMOGLOBIN: 10.2 g/dL — AB (ref 12.0–15.0)
MCH: 26.8 pg (ref 26.0–34.0)
MCHC: 31.3 g/dL (ref 30.0–36.0)
MCV: 85.8 fL (ref 80.0–100.0)
Platelets: 212 10*3/uL (ref 150–400)
RBC: 3.8 MIL/uL — AB (ref 3.87–5.11)
RDW: 13.7 % (ref 11.5–15.5)
WBC: 5.4 10*3/uL (ref 4.0–10.5)
nRBC: 0 % (ref 0.0–0.2)

## 2018-07-11 LAB — TROPONIN I

## 2018-07-11 LAB — LIPASE, BLOOD: Lipase: 36 U/L (ref 11–51)

## 2018-07-11 MED ORDER — ACETAMINOPHEN 325 MG PO TABS
650.0000 mg | ORAL_TABLET | Freq: Once | ORAL | Status: AC
  Administered 2018-07-11: 650 mg via ORAL
  Filled 2018-07-11: qty 2

## 2018-07-11 MED ORDER — SODIUM CHLORIDE 0.9 % IV BOLUS (SEPSIS)
500.0000 mL | Freq: Once | INTRAVENOUS | Status: AC
  Administered 2018-07-11: 500 mL via INTRAVENOUS

## 2018-07-11 MED ORDER — IOPAMIDOL (ISOVUE-300) INJECTION 61%
100.0000 mL | Freq: Once | INTRAVENOUS | Status: AC | PRN
  Administered 2018-07-11: 100 mL via INTRAVENOUS

## 2018-07-11 MED ORDER — SODIUM CHLORIDE 0.9 % IV SOLN
1000.0000 mL | INTRAVENOUS | Status: DC
  Administered 2018-07-11: 1000 mL via INTRAVENOUS

## 2018-07-11 NOTE — ED Notes (Signed)
ED Provider at bedside. 

## 2018-07-11 NOTE — ED Notes (Signed)
Pt states that she had an episode of dizziness and sat down to rest. She states that " later -approx 15 min" she was leaving bedroom and started walking down hall she feels like she fell and hit her head on the wall and fell to the floor. She states that she felt shaky after the fall. Pt states that she is unsure if she experienced loss of consciousness.

## 2018-07-11 NOTE — Discharge Instructions (Signed)
Follow-up with your primary care doctor later this week, return to the emergency room for any recurrent episodes

## 2018-07-11 NOTE — ED Provider Notes (Signed)
[facility] EMERGENCY DEPARTMENT Provider Note   CSN: [id number] Arrival date & time: 07/11/18  1256     History   Chief Complaint Chief Complaint  Patient presents with  . Fall  . Head Injury  . Dizziness    onset at 9am    HPI Carolyn Jimenez is a 72 y.o. female.  HPI Patient presents to the emergency room for evaluation after a possible syncopal episode today.  Patient states earlier this morning she had an episode of lightheadedness when she attempted to stand up from the couch.  She felt lightheaded and woozy and had to sit back down.  The symptoms slowly resolved after about 15 minutes.  Patient was able to get up and she felt like she was fine for may be the next hour or so.  The patient was walking out of the bathroom when she started to feel lightheaded and then she ended up falling hit her head on the wall and landing on the floor.  Patient also has some pain in her right hip.  She denies any vertigo.  She denies any trouble with her speech or vision.  She has no focal weakness.  No chest pain or shortness of breath.  Patient also mentions having some lower abdominal discomfort.  She did have some loose stools.  No vomiting Past Medical History:  Diagnosis Date  . Breast cancer ([place])   . Diabetes mellitus without complication ([place])   . High cholesterol   . Hypertension   . Seizures ([place])     There are no active problems to display for this patient.   Past Surgical History:  Procedure Laterality Date  . BACK SURGERY    . CHOLECYSTECTOMY       OB History   None      Home Medications    Prior to Admission medications   Medication Sig Start Date End Date Taking? Authorizing Provider  amLODipine (NORVASC) 10 MG tablet Take 10 mg by mouth daily.    [provider]  aspirin 81 MG chewable tablet Chew by mouth daily.    [provider]  benazepril (LOTENSIN) 40 MG tablet Take 40 mg by mouth daily.    [provider]    gabapentin (NEURONTIN) 300 MG capsule Take 300 mg by mouth 3 (three) times daily.     [provider]  levETIRAcetam (KEPPRA) 500 MG tablet Take 500 mg by mouth 2 (two) times daily.    [provider]  metoprolol succinate (TOPROL-XL) 25 MG 24 hr tablet Take 25 mg by mouth daily.    [provider]  pantoprazole (PROTONIX) 40 MG tablet Take 40 mg by mouth 2 (two) times daily.    [provider]  rosuvastatin (CRESTOR) 20 MG tablet Take 20 mg by mouth daily.    [provider]  sertraline (ZOLOFT) 50 MG tablet Take 50 mg by mouth 2 (two) times daily. 1 tablet in the morning and 1/2 tablet at night    [provider]  triamterene-hydrochlorothiazide (DYAZIDE) 37.5-25 MG capsule Take 1 capsule by mouth daily.    [provider]    Family History No family history on file.  Social History Social History   Tobacco Use  . Smoking status: Current Every Day Smoker    Types: Cigarettes  . Smokeless tobacco: Never Used  Substance Use Topics  . Alcohol use: No  . Drug use: No     Allergies   Codeine; Latex; and Sulfa  antibiotics   Review of Systems Review of Systems  Cardiovascular: Negative for chest pain.  Gastrointestinal: Negative for blood in stool and vomiting.  Genitourinary: Negative for dysuria.  All other systems reviewed and are negative.    Physical Exam Updated Vital Signs BP (!) 119/54 (BP Location: Left Arm)   Pulse 66   Temp 98 F (36.7 C) (Oral)   Resp 16   Ht 1.626 m (5\' 4" )   Wt 71.7 kg   LMP  (LMP Unknown)   SpO2 100%   BMI 27.12 kg/m   Physical Exam  Constitutional: She is oriented to person, place, and time. She appears well-developed and well-nourished. No distress.  HENT:  Head: Normocephalic and atraumatic.  Right Ear: External ear normal.  Left Ear: External ear normal.  Mouth/Throat: Oropharynx is clear and moist.  Eyes: Conjunctivae are normal. Right eye exhibits no discharge.  Left eye exhibits no discharge. No scleral icterus.  Neck: Neck supple. No tracheal deviation present.  Cardiovascular: Normal rate, regular rhythm and intact distal pulses.  Pulmonary/Chest: Effort normal and breath sounds normal. No stridor. No respiratory distress. She has no wheezes. She has no rales.  Abdominal: Soft. Bowel sounds are normal. She exhibits no distension. There is tenderness in the right lower quadrant. There is no rebound and no guarding.  Musculoskeletal: She exhibits no edema or tenderness.  Neurological: She is alert and oriented to person, place, and time. She has normal strength. No cranial nerve deficit (no facial droop, extraocular movements intact, no slurred speech) or sensory deficit. She exhibits normal muscle tone. She displays no seizure activity. Coordination normal.  No pronator drift bilateral upper extrem, able to hold both legs off bed for 5 seconds, sensation intact in all extremities, no visual field cuts, no left or right sided neglect, normal finger-nose exam bilaterally, no nystagmus noted   Skin: Skin is warm and dry. No rash noted.  Psychiatric: She has a normal mood and affect.  Nursing note and vitals reviewed.    ED Treatments / Results  Labs (all labs ordered are listed, but only abnormal results are displayed) Labs Reviewed  CBC - Abnormal; Notable for the following components:      Result Value   RBC 3.80 (*)    Hemoglobin 10.2 (*)    HCT 32.6 (*)    All other components within normal limits  COMPREHENSIVE METABOLIC PANEL - Abnormal; Notable for the following components:   Potassium 3.3 (*)    Glucose, Bld 149 (*)    Creatinine, Ser 1.20 (*)    GFR calc non Af Amer 44 (*)    GFR calc Af Amer 51 (*)    All other components within normal limits  LIPASE, BLOOD  TROPONIN I  URINALYSIS, ROUTINE W REFLEX MICROSCOPIC    EKG EKG Interpretation  Date/Time:  Saturday July 11 2018 13:13:16 EST Ventricular Rate:  98 PR  Interval:  138 QRS Duration: 82 QT Interval:  426 QTC Calculation: 543 R Axis:   -6 Text Interpretation:  Normal sinus rhythm Nonspecific T wave abnormality Abnormal ECG No significant change since last tracing Confirmed by Dorie Rank [id number]) on 07/11/2018 1:16:13 PM   Radiology Dg Chest 2 View  Result Date: 07/11/2018 CLINICAL DATA:  Fall today.  Hip pain. EXAM: CHEST - 2 VIEW COMPARISON:  01/07/2018 FINDINGS: Cardiac silhouette is borderline enlarged. No mediastinal or hilar masses. No evidence of adenopathy. Clear lungs.  No pleural effusion or pneumothorax. Skeletal structures are unremarkable. IMPRESSION: No active  cardiopulmonary disease. Electronically Signed   By: Lajean Manes M.D.   On: 07/11/2018 15:31   Ct Head Wo Contrast  Result Date: 07/11/2018 CLINICAL DATA:  Patient status post fall.  Headache. EXAM: CT HEAD WITHOUT CONTRAST TECHNIQUE: Contiguous axial images were obtained from the base of the skull through the vertex without intravenous contrast. COMPARISON:  Brain CT 12/07/2017 FINDINGS: Brain: Ventricles and sulci are appropriate for patient's age. No evidence for acute cortically based infarct, intracranial hemorrhage, mass lesion or mass-effect. Vascular: Unremarkable Skull: Intact. Sinuses/Orbits: Paranasal sinuses are well aerated. Mastoid air cells unremarkable. Other: None. IMPRESSION: No acute intracranial process. Electronically Signed   By: Lovey Newcomer M.D.   On: 07/11/2018 15:19   Ct Abdomen Pelvis W Contrast  Result Date: 07/11/2018 CLINICAL DATA:  72 year old female with history of chronic constipation. EXAM: CT ABDOMEN AND PELVIS WITH CONTRAST TECHNIQUE: Multidetector CT imaging of the abdomen and pelvis was performed using the standard protocol following bolus administration of intravenous contrast. CONTRAST:  15mL ISOVUE-300 IOPAMIDOL (ISOVUE-300) INJECTION 61% COMPARISON:  CT the abdomen and pelvis 06/24/2017. FINDINGS: Lower chest: Mild scarring in the  periphery of the right middle lobe. Aortic atherosclerosis. Hepatobiliary: No suspicious cystic or solid hepatic lesions. No intra or extrahepatic biliary ductal dilatation. Status post cholecystectomy. Pancreas: No pancreatic mass. No pancreatic ductal dilatation. No pancreatic or peripancreatic fluid or inflammatory changes. Spleen: Unremarkable. Adrenals/Urinary Tract: Bilateral kidneys and bilateral adrenal glands are normal in appearance. No hydroureteronephrosis. Urinary bladder is normal in appearance. Stomach/Bowel: Normal appearance of the stomach. No pathologic dilatation of small bowel or colon. A few scattered colonic diverticulae are noted, without surrounding inflammatory changes to suggest an acute diverticulitis at this time. Stool burden does not appear excessive. The appendix is not confidently identified and may be surgically absent. Regardless, there are no inflammatory changes noted adjacent to the cecum to suggest the presence of an acute appendicitis at this time. Vascular/Lymphatic: Aortic atherosclerosis, without evidence of aneurysm or dissection in the abdominal or pelvic vasculature. 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 No lymphadenopathy noted in the abdomen or pelvis. Reproductive: Status post hysterectomy. Ovaries are not confidently identified may be surgically absent or atrophic. Other: No significant volume of ascites.  No pneumoperitoneum. Musculoskeletal: Status post PLIF from L4-S1 with interbody grafts at L4-L5 and L5-S1. There are no aggressive appearing lytic or blastic lesions noted in the visualized portions of the skeleton. IMPRESSION: 1. No acute findings are noted in the abdomen or pelvis. 2. Stool burden does not appear excessive. 3. Mild colonic diverticulosis without evidence of acute diverticulitis at this time. 4. Aortic atherosclerosis. Electronically Signed   By: Vinnie Langton M.D.   On: 07/11/2018 15:34   Dg Hip Unilat   With Pelvis 2-3 Views Right  Result Date: 07/11/2018 CLINICAL DATA:  Fall today.  Posterolateral right hip pain. EXAM: DG HIP (WITH OR WITHOUT PELVIS) 2-3V RIGHT COMPARISON:  None. FINDINGS: No fracture or bone lesion. There is acetabular over coverage of both hip joints. Both hip joints are normally aligned. The SI joints and pubic symphysis are normally aligned. There has been a previous posterior lumbar spine fusion, incompletely imaged. Soft tissues are unremarkable. IMPRESSION: No fracture or acute finding. Electronically Signed   By: Lajean Manes M.D.   On: 07/11/2018 15:30    Procedures Procedures (including critical care time)  Medications Ordered in ED Medications  sodium chloride 0.9 % bolus 500 mL (0 mLs Intravenous Stopped 07/11/18 1447)    Followed  by  0.9 %  sodium chloride infusion (1,000 mLs Intravenous New Bag/Given 07/11/18 1515)  acetaminophen (TYLENOL) tablet 650 mg (650 mg Oral Given 07/11/18 1415)  iopamidol (ISOVUE-300) 61 % injection 100 mL (100 mLs Intravenous Contrast Given 07/11/18 1448)     Initial Impression / Assessment and Plan / ED Course  I have reviewed the triage vital signs and the nursing notes.  Pertinent labs & imaging results that were available during my care of the patient were reviewed by me and considered in my medical decision making (see chart for details).   Pt presented with an episode of lightheadedness.  Symptoms were suggestive of an orthostatic episode.  In the emergency room the patient was feeling fine.  She was able to walk around without difficulty.  No findings to suggest acute stroke or TIA.  ED work-up is reassuring.  Head CT is negative.  Abdominal pelvic CT was performed because of her abdominal discomfort and that was normal as well.  Laboratory tests do show mild anemia but the patient is not having any bleeding and this is only slightly lower than previous values.  I doubt this is contributory.  UA is pending.  Anticipate  dc  Final Clinical Impressions(s) / ED Diagnoses   Final diagnoses:  Near syncope    ED Discharge Orders    None       Dorie Rank, MD 07/11/18 1550

## 2018-07-11 NOTE — ED Notes (Signed)
Pt states "I feel better" Denys any dizziness while walking to bathroom. In NAD

## 2018-07-11 NOTE — ED Triage Notes (Signed)
Pt brought in by family for fall about 20 minutes PTA. Pt hit head on corner of wall. Pt also reports she landed on her right hip. Pt thinks her right knee may have given out on her, but pt also reports that she had dizziness at 9 am this morning.

## 2018-07-11 NOTE — ED Notes (Signed)
Patient transported to CT 

## 2018-08-07 ENCOUNTER — Emergency Department (HOSPITAL_BASED_OUTPATIENT_CLINIC_OR_DEPARTMENT_OTHER)
Admission: EM | Admit: 2018-08-07 | Discharge: 2018-08-08 | Disposition: A | Discharge status: Home / Self Care | Payer: Medicare Other | Attending: Emergency Medicine | Admitting: Emergency Medicine

## 2018-08-07 ENCOUNTER — Encounter (HOSPITAL_BASED_OUTPATIENT_CLINIC_OR_DEPARTMENT_OTHER): Payer: Self-pay

## 2018-08-07 ENCOUNTER — Emergency Department (HOSPITAL_BASED_OUTPATIENT_CLINIC_OR_DEPARTMENT_OTHER): Payer: Medicare Other

## 2018-08-07 ENCOUNTER — Other Ambulatory Visit: Payer: Self-pay

## 2018-08-07 DIAGNOSIS — E119 Type 2 diabetes mellitus without complications: Secondary | ICD-10-CM | POA: Insufficient documentation

## 2018-08-07 DIAGNOSIS — F17210 Nicotine dependence, cigarettes, uncomplicated: Secondary | ICD-10-CM | POA: Diagnosis not present

## 2018-08-07 DIAGNOSIS — I10 Essential (primary) hypertension: Secondary | ICD-10-CM | POA: Diagnosis not present

## 2018-08-07 DIAGNOSIS — K529 Noninfective gastroenteritis and colitis, unspecified: Secondary | ICD-10-CM | POA: Diagnosis not present

## 2018-08-07 DIAGNOSIS — R109 Unspecified abdominal pain: Secondary | ICD-10-CM | POA: Diagnosis present

## 2018-08-07 DIAGNOSIS — Z79899 Other long term (current) drug therapy: Secondary | ICD-10-CM | POA: Diagnosis not present

## 2018-08-07 DIAGNOSIS — E785 Hyperlipidemia, unspecified: Secondary | ICD-10-CM | POA: Insufficient documentation

## 2018-08-07 LAB — URINALYSIS, ROUTINE W REFLEX MICROSCOPIC
BILIRUBIN URINE: NEGATIVE
Glucose, UA: NEGATIVE mg/dL
HGB URINE DIPSTICK: NEGATIVE
Ketones, ur: NEGATIVE mg/dL
Nitrite: NEGATIVE
PROTEIN: NEGATIVE mg/dL
Specific Gravity, Urine: 1.02 (ref 1.005–1.030)
pH: 5.5 (ref 5.0–8.0)

## 2018-08-07 LAB — COMPREHENSIVE METABOLIC PANEL
ALT: 12 U/L (ref 0–44)
ANION GAP: 10 (ref 5–15)
AST: 16 U/L (ref 15–41)
Albumin: 4.2 g/dL (ref 3.5–5.0)
Alkaline Phosphatase: 65 U/L (ref 38–126)
BUN: 21 mg/dL (ref 8–23)
CO2: 22 mmol/L (ref 22–32)
Calcium: 9.7 mg/dL (ref 8.9–10.3)
Chloride: 101 mmol/L (ref 98–111)
Creatinine, Ser: 1.2 mg/dL — ABNORMAL HIGH (ref 0.44–1.00)
GFR, EST AFRICAN AMERICAN: 53 mL/min — AB (ref >60)
GFR, EST NON AFRICAN AMERICAN: 45 mL/min — AB (ref >60)
Glucose, Bld: 118 mg/dL — ABNORMAL HIGH (ref 70–99)
POTASSIUM: 3.4 mmol/L — AB (ref 3.5–5.1)
Sodium: 133 mmol/L — ABNORMAL LOW (ref 135–145)
TOTAL PROTEIN: 8.3 g/dL — AB (ref 6.5–8.1)
Total Bilirubin: 0.7 mg/dL (ref 0.3–1.2)

## 2018-08-07 LAB — URINALYSIS, MICROSCOPIC (REFLEX): RBC / HPF: NONE SEEN RBC/hpf (ref 0–5)

## 2018-08-07 LAB — CBC
HCT: 33.6 % — ABNORMAL LOW (ref 36.0–46.0)
Hemoglobin: 10.5 g/dL — ABNORMAL LOW (ref 12.0–15.0)
MCH: 26.7 pg (ref 26.0–34.0)
MCHC: 31.3 g/dL (ref 30.0–36.0)
MCV: 85.5 fL (ref 80.0–100.0)
PLATELETS: 202 10*3/uL (ref 150–400)
RBC: 3.93 MIL/uL (ref 3.87–5.11)
RDW: 14 % (ref 11.5–15.5)
WBC: 8.4 10*3/uL (ref 4.0–10.5)
nRBC: 0 % (ref 0.0–0.2)

## 2018-08-07 LAB — LIPASE, BLOOD: Lipase: 33 U/L (ref 11–51)

## 2018-08-07 MED ORDER — ACETAMINOPHEN 325 MG PO TABS
650.0000 mg | ORAL_TABLET | Freq: Once | ORAL | Status: AC
  Administered 2018-08-07: 650 mg via ORAL
  Filled 2018-08-07: qty 2

## 2018-08-07 MED ORDER — SUCRALFATE 1 G PO TABS
1.0000 g | ORAL_TABLET | Freq: Once | ORAL | Status: AC
  Administered 2018-08-07: 1 g via ORAL
  Filled 2018-08-07: qty 1

## 2018-08-07 MED ORDER — ONDANSETRON HCL 4 MG/2ML IJ SOLN
4.0000 mg | Freq: Once | INTRAMUSCULAR | Status: AC | PRN
  Administered 2018-08-07: 4 mg via INTRAVENOUS
  Filled 2018-08-07: qty 2

## 2018-08-07 MED ORDER — SODIUM CHLORIDE 0.9 % IV BOLUS
500.0000 mL | Freq: Once | INTRAVENOUS | Status: AC
  Administered 2018-08-07: 500 mL via INTRAVENOUS

## 2018-08-07 NOTE — ED Triage Notes (Signed)
C/o abd pain, n/v/d x 2-3 days-entered triage in w/c-groaning

## 2018-08-08 MED ORDER — IOPAMIDOL (ISOVUE-300) INJECTION 61%
75.0000 mL | Freq: Once | INTRAVENOUS | Status: AC | PRN
  Administered 2018-08-08: 75 mL via INTRAVENOUS

## 2018-08-08 MED ORDER — ONDANSETRON 8 MG PO TBDP: 8.0000 mg | ORAL_TABLET | Freq: Three times a day (TID) | ORAL | 0 refills | Status: DC | PRN

## 2018-08-08 MED ORDER — LOPERAMIDE HCL 2 MG PO CAPS: 4.0000 mg | ORAL_CAPSULE | Freq: Every evening | ORAL | 0 refills | Status: DC | PRN

## 2018-08-08 NOTE — Discharge Instructions (Addendum)
He was seen in the ER for diarrhea, vomiting. CT scan does not reveal any concerning finding.  It appears that you have gastroenteritis and he should get better on your own.  We recommend that you taking clear liquid diet for the next 2 days. Please take the nausea and diarrhea medication as prescribed.  Please return to the ER if your symptoms worsen; you have increased pain, fevers, chills, inability to keep any medications down, confusion. Otherwise see the outpatient doctor as requested.

## 2018-08-08 NOTE — ED Provider Notes (Signed)
[facility] EMERGENCY DEPARTMENT Provider Note   CSN: [id number] Arrival date & time: 08/07/18  1858     History   Chief Complaint Chief Complaint  Patient presents with  . Abdominal Pain    HPI Carolyn Jimenez is a 72 y.o. female.  HPI  72 year old female with history of hypertension, hyperlipidemia, diabetes comes in with chief complaint of abdominal pain.  Patient started having abdominal pain with nausea, vomiting and diarrhea 2 or 3 days ago.  Her pain is located over the lower quadrants, it is worse on the left side.  Pain is severe which prompted patient to come to the ER.  She has had about 5 episodes of vomiting today and 2-3 episodes of loose bowel movement.  There has not been any bloody or bilious emesis.  Past Medical History:  Diagnosis Date  . Breast cancer ([place])   . Diabetes mellitus without complication ([place])   . High cholesterol   . Hypertension   . Seizures ([place])     There are no active problems to display for this patient.   Past Surgical History:  Procedure Laterality Date  . BACK SURGERY    . CHOLECYSTECTOMY       OB History   None      Home Medications    Prior to Admission medications   Medication Sig Start Date End Date Taking? Authorizing Provider  amLODipine (NORVASC) 10 MG tablet Take 10 mg by mouth daily.    [provider]  aspirin 81 MG chewable tablet Chew by mouth daily.    [provider]  benazepril (LOTENSIN) 40 MG tablet Take 40 mg by mouth daily.    [provider]  gabapentin (NEURONTIN) 300 MG capsule Take 300 mg by mouth 3 (three) times daily.     [provider]  levETIRAcetam (KEPPRA) 500 MG tablet Take 500 mg by mouth 2 (two) times daily.    [provider]  metoprolol succinate (TOPROL-XL) 25 MG 24 hr tablet Take 25 mg by mouth daily.    [provider]  pantoprazole (PROTONIX) 40 MG tablet Take 40 mg by mouth 2 (two) times daily.    [provider]  rosuvastatin (CRESTOR) 20 MG tablet Take 20 mg by mouth daily.    [provider]  sertraline (ZOLOFT) 50 MG tablet Take 50 mg by mouth 2 (two) times daily. 1 tablet in the morning and 1/2 tablet at night    [provider]  triamterene-hydrochlorothiazide (DYAZIDE) 37.5-25 MG capsule Take 1 capsule by mouth daily.    [provider]    Family History No family history on file.  Social History Social History   Tobacco Use  . Smoking status: Current Every Day Smoker    Types: Cigarettes  . Smokeless tobacco: Never Used  Substance Use Topics  . Alcohol use: No  . Drug use: No     Allergies   Codeine; Latex; and Sulfa antibiotics   Review of Systems Review of Systems  Constitutional: Positive for activity change.  Gastrointestinal: Positive for abdominal pain, diarrhea, nausea and vomiting.  All other systems reviewed and are negative.    Physical Exam Updated Vital Signs BP (!) 138/57   Pulse 63   Temp 98 F (36.7 C) (Oral)   Resp 20   Ht 5\' 4"  (1.626 m)   Wt 71.2 kg   LMP  (LMP Unknown)   SpO2 96%   BMI 26.95 kg/m   Physical Exam  Constitutional: She is oriented to person, place, and time. She appears well-developed.  HENT:  Head: Normocephalic and atraumatic.  Eyes: EOM are normal.  Neck: Normal range of motion. Neck supple.  Cardiovascular: Normal rate.  Pulmonary/Chest: Effort normal.  Abdominal: Bowel sounds are normal. There is tenderness in the right lower quadrant, suprapubic area and left lower quadrant. There is guarding. There is no rebound.  Neurological: She is alert and oriented to person, place, and time.  Skin: Skin is warm and dry.  Nursing note and vitals reviewed.    ED Treatments / Results  Labs (all labs ordered are listed, but only abnormal results are displayed) Labs Reviewed  COMPREHENSIVE METABOLIC PANEL - Abnormal; Notable for the following components:      Result Value   Sodium 133  (*)    Potassium 3.4 (*)    Glucose, Bld 118 (*)    Creatinine, Ser 1.20 (*)    Total Protein 8.3 (*)    GFR calc non Af Amer 45 (*)    GFR calc Af Amer 53 (*)    All other components within normal limits  CBC - Abnormal; Notable for the following components:   Hemoglobin 10.5 (*)    HCT 33.6 (*)    All other components within normal limits  URINALYSIS, ROUTINE W REFLEX MICROSCOPIC - Abnormal; Notable for the following components:   Leukocytes, UA TRACE (*)    All other components within normal limits  URINALYSIS, MICROSCOPIC (REFLEX) - Abnormal; Notable for the following components:   Bacteria, UA FEW (*)    All other components within normal limits  LIPASE, BLOOD    EKG None  Radiology No results found.  Procedures Procedures (including critical care time)  Medications Ordered in ED Medications  ondansetron (ZOFRAN) injection 4 mg (4 mg Intravenous Given 08/07/18 2150)  sucralfate (CARAFATE) tablet 1 g (1 g Oral Given 08/07/18 2215)  sodium chloride 0.9 % bolus 500 mL (500 mLs Intravenous New Bag/Given 08/07/18 2326)  acetaminophen (TYLENOL) tablet 650 mg (650 mg Oral Given 08/07/18 2322)  iopamidol (ISOVUE-300) 61 % injection 75 mL (75 mLs Intravenous Contrast Given 08/08/18 0003)     Initial Impression / Assessment and Plan / ED Course  I have reviewed the triage vital signs and the nursing notes.  Pertinent labs & imaging results that were available during my care of the patient were reviewed by me and considered in my medical decision making (see chart for details).     72 year old female comes in with chief complaint of abdominal pain.  She has history of diabetes, hypertension and hyperlipidemia.  On exam she has lower quadrant tenderness with guarding.  She is also having nausea, vomiting and diarrhea.  Her labs are reassuring.  UA appears to be clean and patient has no UTI-like symptoms.  Upon reassessment, patient reports that her pain has gotten worse.   Therefore we will proceed with CT abdomen and pelvis to ensure there is no perforation or diverticulitis.  Final Clinical Impressions(s) / ED Diagnoses   Final diagnoses:  None    ED Discharge Orders    None       Varney Biles, MD 08/08/18 0106

## 2019-03-26 ENCOUNTER — Other Ambulatory Visit: Payer: Self-pay

## 2019-03-26 ENCOUNTER — Encounter (HOSPITAL_BASED_OUTPATIENT_CLINIC_OR_DEPARTMENT_OTHER): Payer: Self-pay | Admitting: *Deleted

## 2019-03-26 ENCOUNTER — Emergency Department (HOSPITAL_BASED_OUTPATIENT_CLINIC_OR_DEPARTMENT_OTHER)
Admission: EM | Admit: 2019-03-26 | Discharge: 2019-03-26 | Disposition: A | Discharge status: Home / Self Care | Payer: Medicare Other | Attending: Emergency Medicine | Admitting: Emergency Medicine

## 2019-03-26 ENCOUNTER — Emergency Department (HOSPITAL_BASED_OUTPATIENT_CLINIC_OR_DEPARTMENT_OTHER): Payer: Medicare Other

## 2019-03-26 DIAGNOSIS — Z7982 Long term (current) use of aspirin: Secondary | ICD-10-CM | POA: Insufficient documentation

## 2019-03-26 DIAGNOSIS — Z79899 Other long term (current) drug therapy: Secondary | ICD-10-CM | POA: Diagnosis not present

## 2019-03-26 DIAGNOSIS — I10 Essential (primary) hypertension: Secondary | ICD-10-CM | POA: Insufficient documentation

## 2019-03-26 DIAGNOSIS — Z853 Personal history of malignant neoplasm of breast: Secondary | ICD-10-CM | POA: Diagnosis not present

## 2019-03-26 DIAGNOSIS — R197 Diarrhea, unspecified: Secondary | ICD-10-CM | POA: Insufficient documentation

## 2019-03-26 DIAGNOSIS — R1013 Epigastric pain: Secondary | ICD-10-CM | POA: Insufficient documentation

## 2019-03-26 DIAGNOSIS — Z91040 Latex allergy status: Secondary | ICD-10-CM | POA: Diagnosis not present

## 2019-03-26 DIAGNOSIS — E119 Type 2 diabetes mellitus without complications: Secondary | ICD-10-CM | POA: Diagnosis not present

## 2019-03-26 DIAGNOSIS — F17210 Nicotine dependence, cigarettes, uncomplicated: Secondary | ICD-10-CM | POA: Diagnosis not present

## 2019-03-26 LAB — COMPREHENSIVE METABOLIC PANEL
ALT: 16 U/L (ref 0–44)
AST: 22 U/L (ref 15–41)
Albumin: 4.1 g/dL (ref 3.5–5.0)
Alkaline Phosphatase: 90 U/L (ref 38–126)
Anion gap: 10 (ref 5–15)
BUN: 19 mg/dL (ref 8–23)
CO2: 22 mmol/L (ref 22–32)
Calcium: 8.9 mg/dL (ref 8.9–10.3)
Chloride: 103 mmol/L (ref 98–111)
Creatinine, Ser: 1.1 mg/dL — ABNORMAL HIGH (ref 0.44–1.00)
GFR calc Af Amer: 58 mL/min — ABNORMAL LOW (ref >60)
GFR calc non Af Amer: 50 mL/min — ABNORMAL LOW (ref >60)
Glucose, Bld: 98 mg/dL (ref 70–99)
Potassium: 4.3 mmol/L (ref 3.5–5.1)
Sodium: 135 mmol/L (ref 135–145)
Total Bilirubin: 0.7 mg/dL (ref 0.3–1.2)
Total Protein: 7.8 g/dL (ref 6.5–8.1)

## 2019-03-26 LAB — CBC WITH DIFFERENTIAL/PLATELET
Abs Immature Granulocytes: 0.01 10*3/uL (ref 0.00–0.07)
Basophils Absolute: 0.1 10*3/uL (ref 0.0–0.1)
Basophils Relative: 1 %
Eosinophils Absolute: 0.1 10*3/uL (ref 0.0–0.5)
Eosinophils Relative: 2 %
HCT: 34.8 % — ABNORMAL LOW (ref 36.0–46.0)
Hemoglobin: 10.8 g/dL — ABNORMAL LOW (ref 12.0–15.0)
Immature Granulocytes: 0 %
Lymphocytes Relative: 22 %
Lymphs Abs: 1.3 10*3/uL (ref 0.7–4.0)
MCH: 27.8 pg (ref 26.0–34.0)
MCHC: 31 g/dL (ref 30.0–36.0)
MCV: 89.5 fL (ref 80.0–100.0)
Monocytes Absolute: 0.3 10*3/uL (ref 0.1–1.0)
Monocytes Relative: 6 %
Neutro Abs: 3.9 10*3/uL (ref 1.7–7.7)
Neutrophils Relative %: 69 %
Platelets: 173 10*3/uL (ref 150–400)
RBC: 3.89 MIL/uL (ref 3.87–5.11)
RDW: 14.9 % (ref 11.5–15.5)
WBC: 5.6 10*3/uL (ref 4.0–10.5)
nRBC: 0 % (ref 0.0–0.2)

## 2019-03-26 LAB — LIPASE, BLOOD: Lipase: 29 U/L (ref 11–51)

## 2019-03-26 LAB — URINALYSIS, ROUTINE W REFLEX MICROSCOPIC
Bilirubin Urine: NEGATIVE
Glucose, UA: NEGATIVE mg/dL
Hgb urine dipstick: NEGATIVE
Ketones, ur: NEGATIVE mg/dL
Leukocytes,Ua: NEGATIVE
Nitrite: NEGATIVE
Protein, ur: NEGATIVE mg/dL
Specific Gravity, Urine: <1.005 — ABNORMAL LOW (ref 1.005–1.030)
pH: 6 (ref 5.0–8.0)

## 2019-03-26 MED ORDER — HYOSCYAMINE SULFATE 0.125 MG SL SUBL
0.1250 mg | SUBLINGUAL_TABLET | Freq: Once | SUBLINGUAL | Status: AC
  Administered 2019-03-26: 0.125 mg via SUBLINGUAL
  Filled 2019-03-26: qty 1

## 2019-03-26 MED ORDER — IOHEXOL 300 MG/ML  SOLN
100.0000 mL | Freq: Once | INTRAMUSCULAR | Status: AC | PRN
  Administered 2019-03-26: 100 mL via INTRAVENOUS

## 2019-03-26 MED ORDER — MORPHINE SULFATE (PF) 4 MG/ML IV SOLN
4.0000 mg | Freq: Once | INTRAVENOUS | Status: AC
  Administered 2019-03-26: 4 mg via INTRAVENOUS
  Filled 2019-03-26: qty 1

## 2019-03-26 MED ORDER — SUCRALFATE 1 GM/10ML PO SUSP: 1.0000 g | Freq: Three times a day (TID) | ORAL | 0 refills | Status: DC

## 2019-03-26 MED ORDER — SODIUM CHLORIDE 0.9 % IV SOLN
Freq: Once | INTRAVENOUS | Status: AC
  Administered 2019-03-26: 17:00:00 via INTRAVENOUS

## 2019-03-26 MED ORDER — HYOSCYAMINE SULFATE SL 0.125 MG SL SUBL: 0.1250 | SUBLINGUAL_TABLET | Freq: Three times a day (TID) | SUBLINGUAL | 0 refills | Status: DC | PRN

## 2019-03-26 NOTE — ED Triage Notes (Addendum)
Abdominal pain for a week. Pain started after having a colonoscopy. Last night she had a fever. No fever today. Diarrhea all week. She was seen by her GYN for vaginal bleeding. She had a pap smear and an [place] for the abdominal pain and started on an antibiotic for inflammation in her colon. She has taken the antibiotic x 3 days. No improvement of the pain at this time.

## 2019-03-26 NOTE — ED Notes (Signed)
Pt reports pain worsened after drinking contrast drink.  Provider aware.

## 2019-03-26 NOTE — Discharge Instructions (Addendum)
1.  Continue your Protonix daily.  Add Carafate 4 times a day before meals and bedtime.  Try Levsin tablet for cramping and spasming pain once every 8 hours if needed. 2.  Call your family doctor and your gastroenterologist Monday to let them know you are seen in the emergency department or having problems with ongoing pain. 3.  Return to the emergency department if you develop vomiting, fever or other concerning symptoms.

## 2019-03-26 NOTE — ED Provider Notes (Signed)
[facility] EMERGENCY DEPARTMENT Provider Note   CSN: [id number] Arrival date & time: 03/26/19  1427     History   Chief Complaint Chief Complaint  Patient presents with  . Abdominal Pain    HPI Carolyn Jimenez is a 73 y.o. female.     HPI Patient reports he had a colonoscopy a week ago.  He reports he been having problems with pain and diarrhea since that time.  She reports he has had recurrent episodes of diarrhea but today that does seem to have abated.  No vomiting.  She reports she felt like she had a fever and some chills yesterday.  She measured her temperature up to 99.  She reports she is having persistent abdominal pain she describes upper around the epigastrium that radiates around her sides.  She is worried about a kidney infection but is not having any pain burning or urgency with urination. Past Medical History:  Diagnosis Date  . Breast cancer ([place])   . Diabetes mellitus without complication ([place])   . High cholesterol   . Hypertension   . Seizures ([place])     There are no active problems to display for this patient.   Past Surgical History:  Procedure Laterality Date  . BACK SURGERY    . CHOLECYSTECTOMY       OB History   No obstetric history on file.      Home Medications    Prior to Admission medications   Medication Sig Start Date End Date Taking? Authorizing Provider  amLODipine (NORVASC) 10 MG tablet Take 10 mg by mouth daily.   Yes [provider]  ARIPiprazole (ABILIFY) 2 MG tablet Take 1/2 tablet daily for one week then take 1 tablet daily 03/11/19  Yes [provider]  aspirin 81 MG chewable tablet Chew by mouth daily.   Yes [provider]  benazepril (LOTENSIN) 40 MG tablet Take 40 mg by mouth daily.   Yes [provider]  gabapentin (NEURONTIN) 300 MG capsule Take 300 mg by mouth 3 (three) times daily.    Yes [provider]  levETIRAcetam (KEPPRA) 500 MG tablet Take 500 mg by mouth 2  (two) times daily.   Yes [provider]  metoprolol succinate (TOPROL-XL) 25 MG 24 hr tablet Take 25 mg by mouth daily.   Yes [provider]  pantoprazole (PROTONIX) 40 MG tablet Take 40 mg by mouth 2 (two) times daily.   Yes [provider]  rosuvastatin (CRESTOR) 20 MG tablet Take 20 mg by mouth daily.   Yes [provider]  sertraline (ZOLOFT) 50 MG tablet Take 50 mg by mouth 2 (two) times daily. 1 tablet in the morning and 1/2 tablet at night   Yes [provider]  traZODone (DESYREL) 50 MG tablet Take  2 tabs at night 12/14/18  Yes [provider]  triamterene-hydrochlorothiazide (DYAZIDE) 37.5-25 MG capsule Take 1 capsule by mouth daily.   Yes [provider]  loperamide (IMODIUM) 2 MG capsule Take 2 capsules (4 mg total) by mouth at bedtime as needed for diarrhea or loose stools. 08/08/18   Varney Biles, MD  ondansetron (ZOFRAN ODT) 8 MG disintegrating tablet Take 1 tablet (8 mg total) by mouth every 8 (eight) hours as needed for nausea. 08/08/18   Varney Biles, MD    Family History No family history on file.  Social History Social History   Tobacco Use  . Smoking status: Current Every Day Smoker    Types:  Cigarettes  . Smokeless tobacco: Never Used  Substance Use Topics  . Alcohol use: No  . Drug use: No     Allergies   Codeine, Latex, and Sulfa antibiotics   Review of Systems Review of Systems 10 Systems reviewed and are negative for acute change except as noted in the HPI.   Physical Exam Updated Vital Signs BP (!) 152/60   Pulse 88   Temp 99.1 F (37.3 C) (Oral)   Resp 14   Ht 5\' 4"  (1.626 m)   Wt 84.7 kg   LMP  (LMP Unknown)   SpO2 100%   BMI 32.05 kg/m   Physical Exam Constitutional:      Comments: Alert and nontoxic.  No respiratory distress.  Appears to be in moderate pain.  HENT:     Head: Normocephalic and atraumatic.  Eyes:     Extraocular Movements: Extraocular movements  intact.     Conjunctiva/sclera: Conjunctivae normal.  Cardiovascular:     Rate and Rhythm: Normal rate and regular rhythm.  Pulmonary:     Effort: Pulmonary effort is normal.     Breath sounds: Normal breath sounds.  Abdominal:     Comments: Abdomen soft with moderate to severe pain in the epigastrium and left upper quadrant.  No guarding.  Musculoskeletal: Normal range of motion.        General: No swelling or tenderness.     Right lower leg: No edema.     Left lower leg: No edema.  Skin:    General: Skin is warm and dry.  Neurological:     General: No focal deficit present.     Mental Status: She is oriented to person, place, and time.     Coordination: Coordination normal.  Psychiatric:        Mood and Affect: Mood normal.      ED Treatments / Results  Labs (all labs ordered are listed, but only abnormal results are displayed) Labs Reviewed - No data to display  EKG None  Radiology No results found.  Procedures Procedures (including critical care time)  Medications Ordered in ED Medications - No data to display   Initial Impression / Assessment and Plan / ED Course  I have reviewed the triage vital signs and the nursing notes.  Pertinent labs & imaging results that were available during my care of the patient were reviewed by me and considered in my medical decision making (see chart for details).        Patient with ongoing upper abdominal pain reportedly since a colonoscopy 1 week ago.  Patient is nontoxic.  She is not having vomiting but reports she has been having diarrhea leading up to this.  Abdomen is positive for significant discomfort with palpation but no guarding.  CT does not show acute findings.  Lab work and urine are normal.  At this time will have the patient continue her daily Protonix as prescribed.  We will add Carafate and Levsin for pain control.  Patient is counseled to call her PCP and her GI physician on Monday to discuss ongoing pain and  close follow-up.  Return precautions reviewed.  Final Clinical Impressions(s) / ED Diagnoses   Final diagnoses:  Epigastric pain    ED Discharge Orders    None       Charlesetta Shanks, MD 03/26/19 0454

## 2020-03-08 IMAGING — CR DG CHEST 2V
2 series · 2 of 2 positions shown · non-contrast
Comparison: 01/07/2018

CLINICAL DATA: Fall today.  Hip pain.

EXAM:
CHEST - 2 VIEW

[w chest pa]
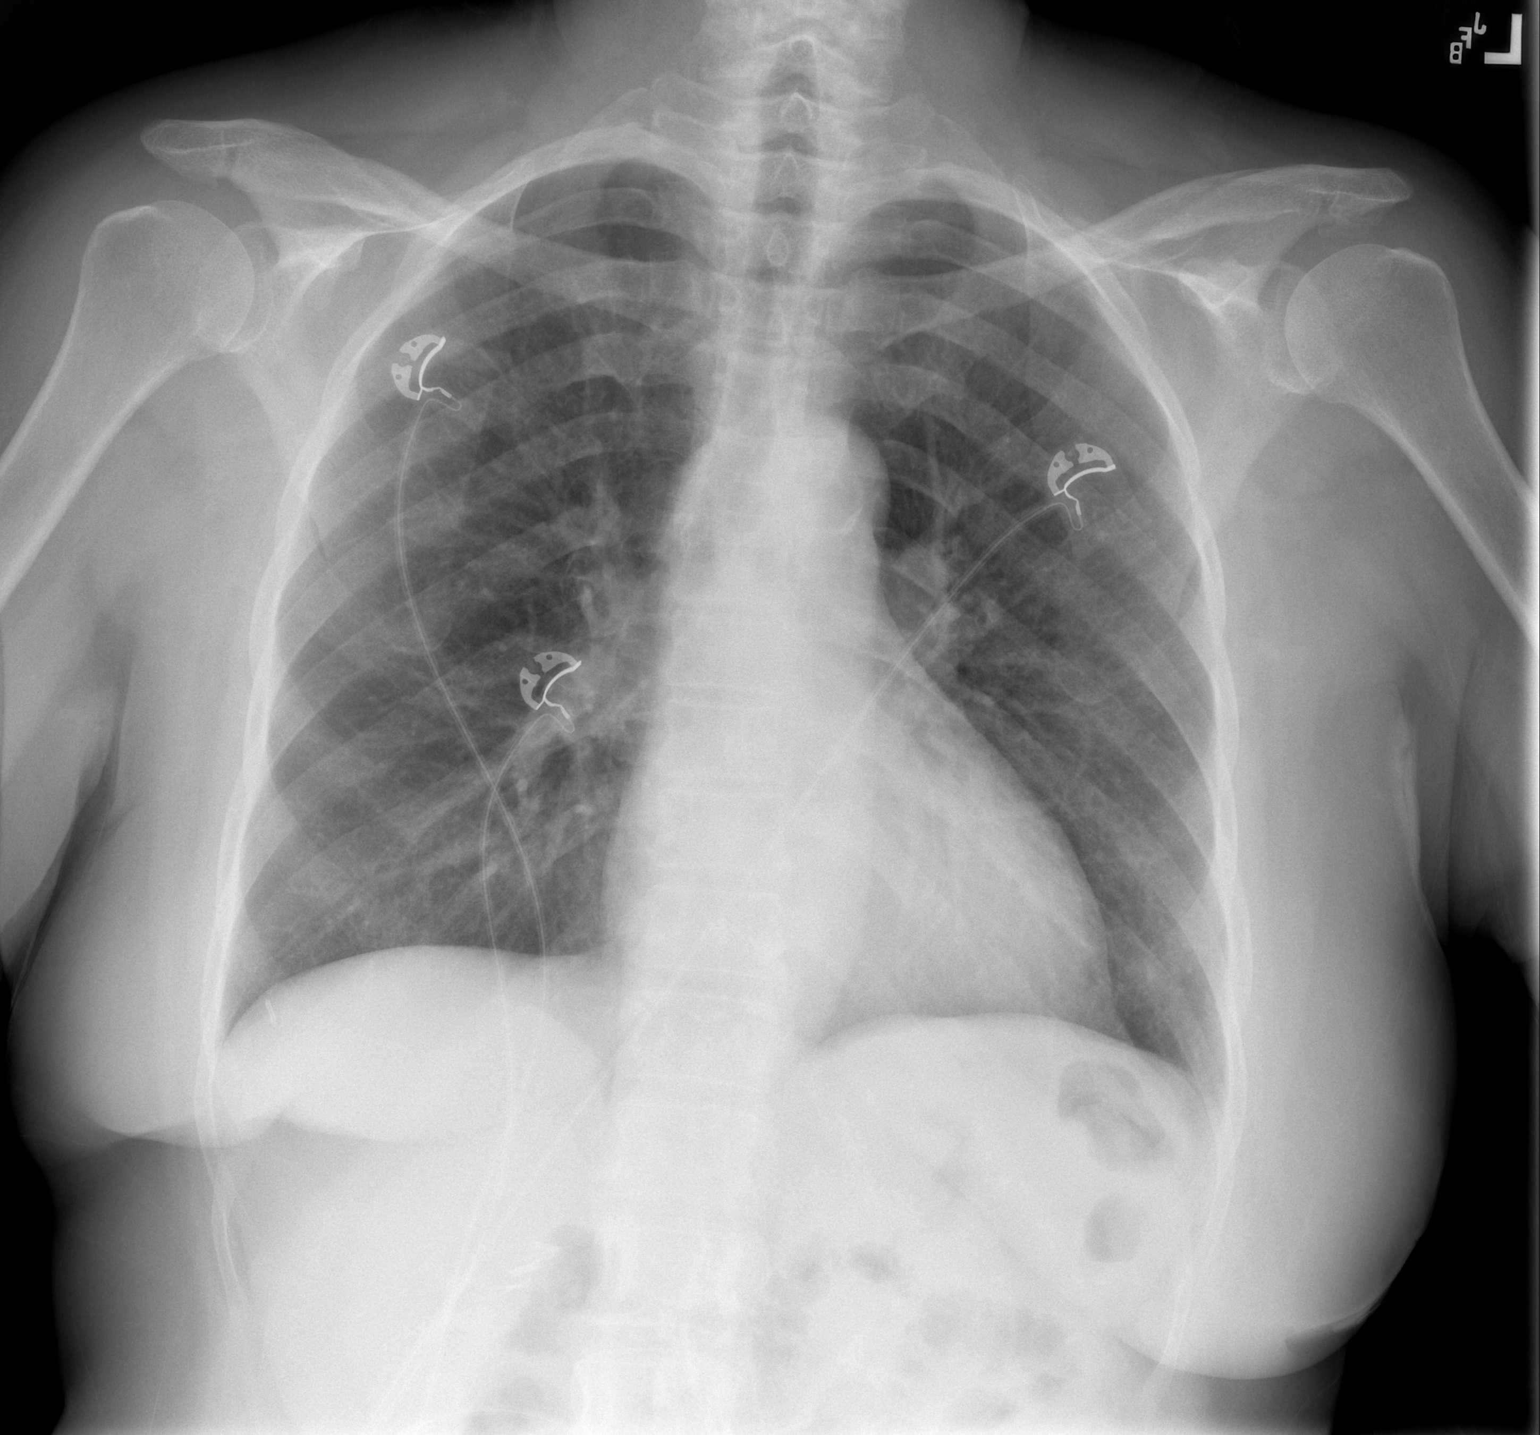

[w chest lat]
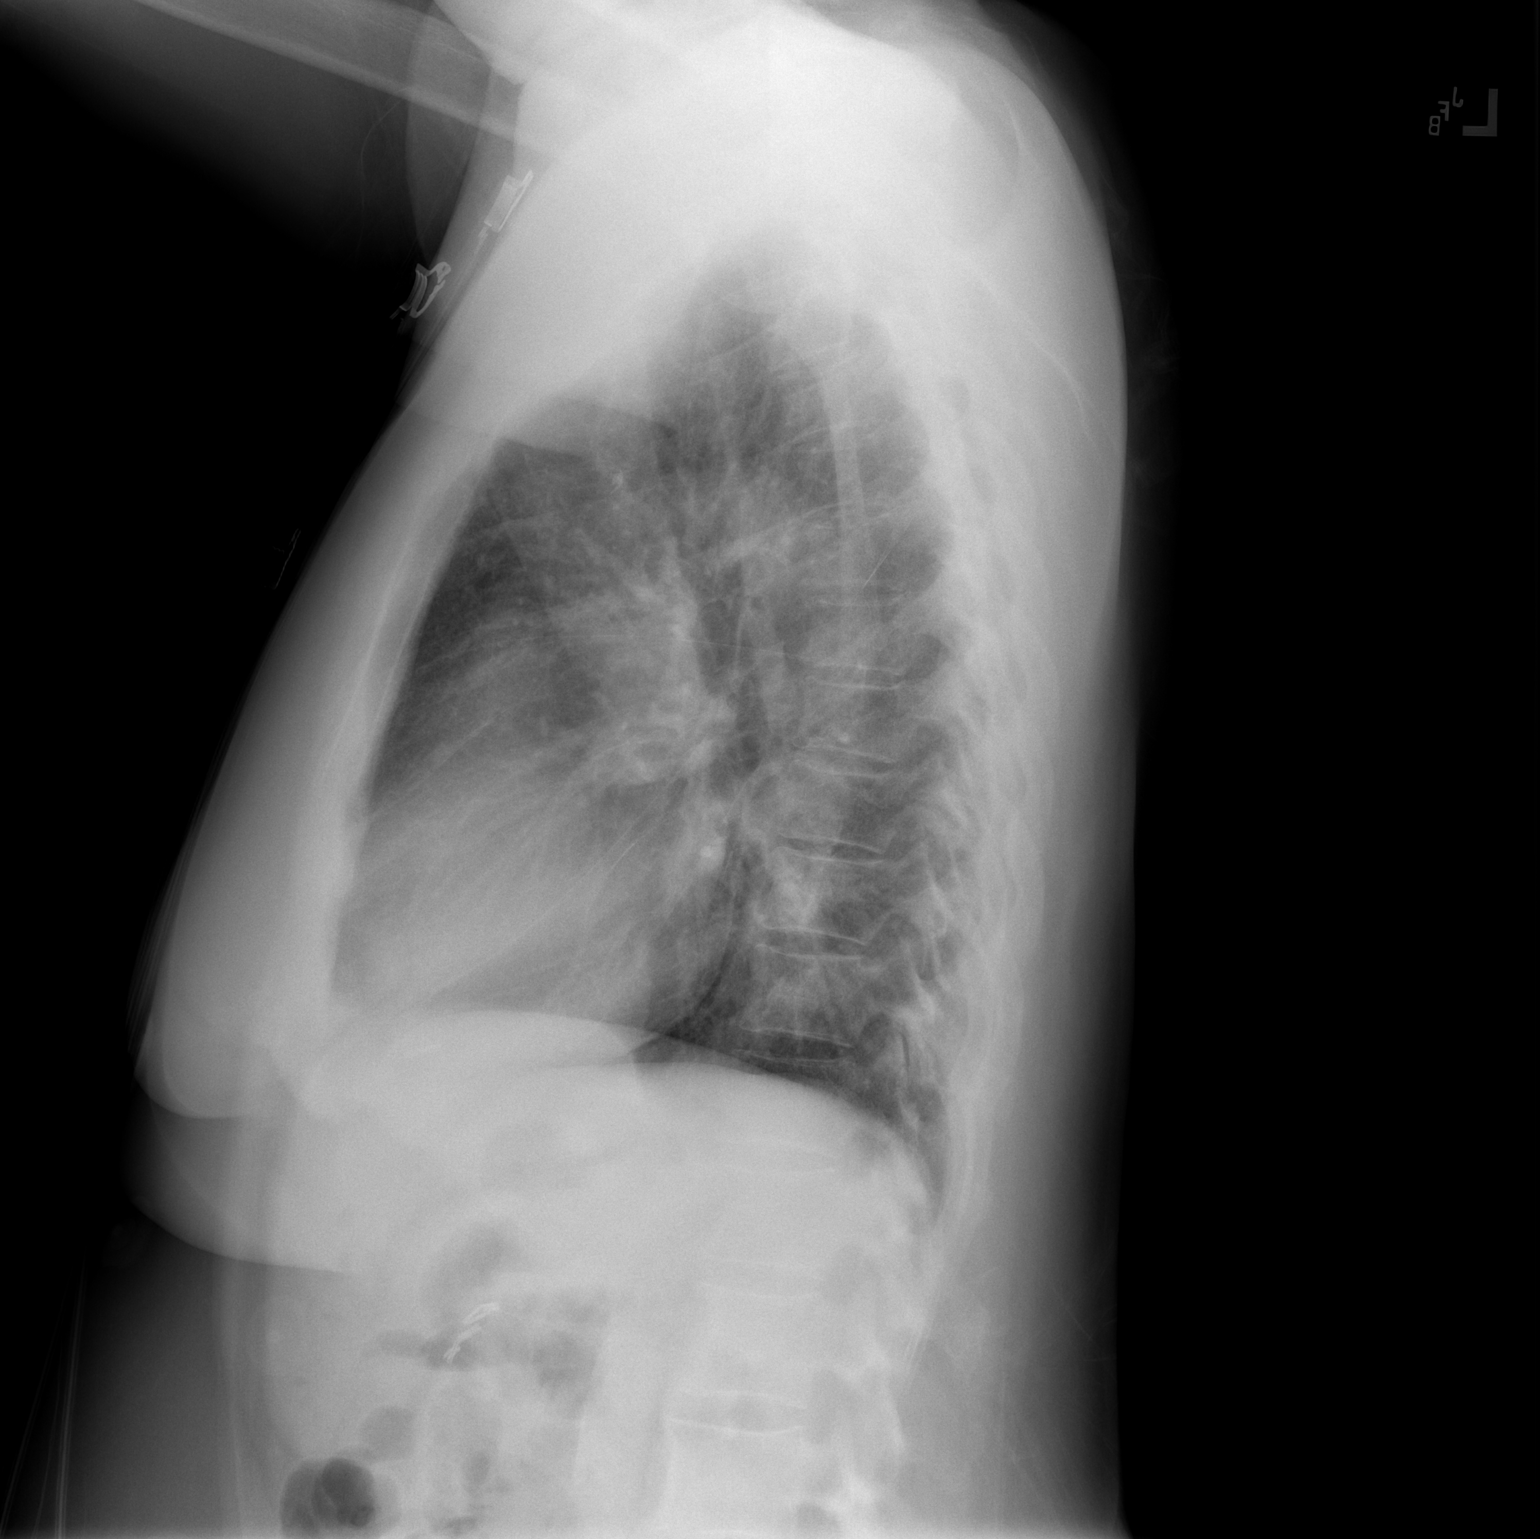

[2 of 2 positions shown; findings below may reference images not displayed]

FINDINGS: Cardiac silhouette is borderline enlarged. No mediastinal or hilar
masses. No evidence of adenopathy.

Clear lungs.  No pleural effusion or pneumothorax.

Skeletal structures are unremarkable.
IMPRESSION: No active cardiopulmonary disease.

## 2023-02-28 ENCOUNTER — Encounter (HOSPITAL_BASED_OUTPATIENT_CLINIC_OR_DEPARTMENT_OTHER): Payer: Self-pay

## 2023-02-28 ENCOUNTER — Inpatient Hospital Stay (HOSPITAL_COMMUNITY): Payer: Medicare Other

## 2023-02-28 ENCOUNTER — Other Ambulatory Visit: Payer: Self-pay

## 2023-02-28 ENCOUNTER — Inpatient Hospital Stay (HOSPITAL_BASED_OUTPATIENT_CLINIC_OR_DEPARTMENT_OTHER)
Admission: EM | Admit: 2023-02-28 | Discharge: 2023-03-05 | DRG: 683 | Disposition: A | Discharge status: Home / Self Care | Payer: Medicare Other | Attending: Internal Medicine | Admitting: Internal Medicine

## 2023-02-28 ENCOUNTER — Emergency Department (HOSPITAL_BASED_OUTPATIENT_CLINIC_OR_DEPARTMENT_OTHER): Payer: Medicare Other

## 2023-02-28 DIAGNOSIS — F32A Depression, unspecified: Secondary | ICD-10-CM | POA: Diagnosis present

## 2023-02-28 DIAGNOSIS — R197 Diarrhea, unspecified: Secondary | ICD-10-CM | POA: Diagnosis present

## 2023-02-28 DIAGNOSIS — Z7985 Long-term (current) use of injectable non-insulin antidiabetic drugs: Secondary | ICD-10-CM

## 2023-02-28 DIAGNOSIS — Z79899 Other long term (current) drug therapy: Secondary | ICD-10-CM

## 2023-02-28 DIAGNOSIS — Z91040 Latex allergy status: Secondary | ICD-10-CM

## 2023-02-28 DIAGNOSIS — Z7984 Long term (current) use of oral hypoglycemic drugs: Secondary | ICD-10-CM

## 2023-02-28 DIAGNOSIS — D649 Anemia, unspecified: Secondary | ICD-10-CM

## 2023-02-28 DIAGNOSIS — R627 Adult failure to thrive: Secondary | ICD-10-CM | POA: Diagnosis present

## 2023-02-28 DIAGNOSIS — Z77120 Contact with and (suspected) exposure to mold (toxic): Secondary | ICD-10-CM

## 2023-02-28 DIAGNOSIS — L989 Disorder of the skin and subcutaneous tissue, unspecified: Secondary | ICD-10-CM | POA: Diagnosis present

## 2023-02-28 DIAGNOSIS — E860 Dehydration: Secondary | ICD-10-CM | POA: Diagnosis present

## 2023-02-28 DIAGNOSIS — I10 Essential (primary) hypertension: Secondary | ICD-10-CM | POA: Diagnosis present

## 2023-02-28 DIAGNOSIS — E7800 Pure hypercholesterolemia, unspecified: Secondary | ICD-10-CM | POA: Diagnosis present

## 2023-02-28 DIAGNOSIS — Z853 Personal history of malignant neoplasm of breast: Secondary | ICD-10-CM

## 2023-02-28 DIAGNOSIS — E861 Hypovolemia: Secondary | ICD-10-CM | POA: Diagnosis present

## 2023-02-28 DIAGNOSIS — D509 Iron deficiency anemia, unspecified: Secondary | ICD-10-CM | POA: Diagnosis present

## 2023-02-28 DIAGNOSIS — N179 Acute kidney failure, unspecified: Secondary | ICD-10-CM | POA: Diagnosis not present

## 2023-02-28 DIAGNOSIS — G40909 Epilepsy, unspecified, not intractable, without status epilepticus: Secondary | ICD-10-CM | POA: Diagnosis present

## 2023-02-28 DIAGNOSIS — F17210 Nicotine dependence, cigarettes, uncomplicated: Secondary | ICD-10-CM | POA: Diagnosis present

## 2023-02-28 DIAGNOSIS — R21 Rash and other nonspecific skin eruption: Secondary | ICD-10-CM | POA: Diagnosis present

## 2023-02-28 DIAGNOSIS — Z882 Allergy status to sulfonamides status: Secondary | ICD-10-CM

## 2023-02-28 DIAGNOSIS — Z885 Allergy status to narcotic agent status: Secondary | ICD-10-CM

## 2023-02-28 DIAGNOSIS — Z7982 Long term (current) use of aspirin: Secondary | ICD-10-CM

## 2023-02-28 DIAGNOSIS — R531 Weakness: Secondary | ICD-10-CM

## 2023-02-28 DIAGNOSIS — E871 Hypo-osmolality and hyponatremia: Secondary | ICD-10-CM | POA: Diagnosis present

## 2023-02-28 DIAGNOSIS — E876 Hypokalemia: Secondary | ICD-10-CM | POA: Diagnosis present

## 2023-02-28 DIAGNOSIS — E119 Type 2 diabetes mellitus without complications: Secondary | ICD-10-CM | POA: Diagnosis present

## 2023-02-28 DIAGNOSIS — E872 Acidosis, unspecified: Secondary | ICD-10-CM | POA: Diagnosis present

## 2023-02-28 DIAGNOSIS — Z6830 Body mass index (BMI) 30.0-30.9, adult: Secondary | ICD-10-CM

## 2023-02-28 DIAGNOSIS — W57XXXA Bitten or stung by nonvenomous insect and other nonvenomous arthropods, initial encounter: Secondary | ICD-10-CM | POA: Diagnosis present

## 2023-02-28 LAB — CBC WITH DIFFERENTIAL/PLATELET
Abs Immature Granulocytes: 0.08 10*3/uL — ABNORMAL HIGH (ref 0.00–0.07)
Basophils Absolute: 0 10*3/uL (ref 0.0–0.1)
Basophils Relative: 0 %
Eosinophils Absolute: 0.1 10*3/uL (ref 0.0–0.5)
Eosinophils Relative: 1 %
HCT: 26.1 % — ABNORMAL LOW (ref 36.0–46.0)
Hemoglobin: 8.4 g/dL — ABNORMAL LOW (ref 12.0–15.0)
Immature Granulocytes: 1 %
Lymphocytes Relative: 9 %
Lymphs Abs: 1 10*3/uL (ref 0.7–4.0)
MCH: 26.6 pg (ref 26.0–34.0)
MCHC: 32.2 g/dL (ref 30.0–36.0)
MCV: 82.6 fL (ref 80.0–100.0)
Monocytes Absolute: 0.6 10*3/uL (ref 0.1–1.0)
Monocytes Relative: 5 %
Neutro Abs: 9.3 10*3/uL — ABNORMAL HIGH (ref 1.7–7.7)
Neutrophils Relative %: 84 %
Platelets: 301 10*3/uL (ref 150–400)
RBC: 3.16 MIL/uL — ABNORMAL LOW (ref 3.87–5.11)
RDW: 15.9 % — ABNORMAL HIGH (ref 11.5–15.5)
WBC: 11.1 10*3/uL — ABNORMAL HIGH (ref 4.0–10.5)
nRBC: 0 % (ref 0.0–0.2)

## 2023-02-28 LAB — URINALYSIS, MICROSCOPIC (REFLEX)

## 2023-02-28 LAB — COMPREHENSIVE METABOLIC PANEL
ALT: 57 U/L — ABNORMAL HIGH (ref 0–44)
AST: 57 U/L — ABNORMAL HIGH (ref 15–41)
Albumin: 2.8 g/dL — ABNORMAL LOW (ref 3.5–5.0)
Alkaline Phosphatase: 69 U/L (ref 38–126)
Anion gap: 14 (ref 5–15)
BUN: 42 mg/dL — ABNORMAL HIGH (ref 8–23)
CO2: 17 mmol/L — ABNORMAL LOW (ref 22–32)
Calcium: 8.4 mg/dL — ABNORMAL LOW (ref 8.9–10.3)
Chloride: 102 mmol/L (ref 98–111)
Creatinine, Ser: 3.22 mg/dL — ABNORMAL HIGH (ref 0.44–1.00)
GFR, Estimated: 14 mL/min — ABNORMAL LOW (ref >60)
Glucose, Bld: 104 mg/dL — ABNORMAL HIGH (ref 70–99)
Potassium: 2.9 mmol/L — ABNORMAL LOW (ref 3.5–5.1)
Sodium: 133 mmol/L — ABNORMAL LOW (ref 135–145)
Total Bilirubin: 0.8 mg/dL (ref 0.3–1.2)
Total Protein: 7.9 g/dL (ref 6.5–8.1)

## 2023-02-28 LAB — URINALYSIS, ROUTINE W REFLEX MICROSCOPIC
Glucose, UA: NEGATIVE mg/dL
Ketones, ur: NEGATIVE mg/dL
Leukocytes,Ua: NEGATIVE
Nitrite: NEGATIVE
Protein, ur: >=300 mg/dL — AB
Specific Gravity, Urine: 1.025 (ref 1.005–1.030)
pH: 5.5 (ref 5.0–8.0)

## 2023-02-28 LAB — MAGNESIUM: Magnesium: 1.8 mg/dL (ref 1.7–2.4)

## 2023-02-28 LAB — CK: Total CK: 180 U/L (ref 38–234)

## 2023-02-28 LAB — GLUCOSE, CAPILLARY: Glucose-Capillary: 95 mg/dL (ref 70–99)

## 2023-02-28 MED ORDER — MELATONIN 3 MG PO TABS
3.0000 mg | ORAL_TABLET | Freq: Every evening | ORAL | Status: DC | PRN
  Administered 2023-02-28 – 2023-03-03 (×3): 3 mg via ORAL
  Filled 2023-02-28 (×3): qty 1

## 2023-02-28 MED ORDER — POTASSIUM CHLORIDE CRYS ER 20 MEQ PO TBCR
40.0000 meq | EXTENDED_RELEASE_TABLET | Freq: Once | ORAL | Status: AC
  Administered 2023-02-28: 40 meq via ORAL
  Filled 2023-02-28: qty 2

## 2023-02-28 MED ORDER — POTASSIUM CHLORIDE 10 MEQ/100ML IV SOLN
10.0000 meq | INTRAVENOUS | Status: AC
  Administered 2023-02-28 (×2): 10 meq via INTRAVENOUS
  Filled 2023-02-28 (×2): qty 100

## 2023-02-28 MED ORDER — ACETAMINOPHEN 325 MG PO TABS
650.0000 mg | ORAL_TABLET | Freq: Once | ORAL | Status: AC
  Administered 2023-02-28: 650 mg via ORAL
  Filled 2023-02-28: qty 2

## 2023-02-28 MED ORDER — LEVETIRACETAM 500 MG PO TABS
500.0000 mg | ORAL_TABLET | Freq: Two times a day (BID) | ORAL | Status: DC
  Administered 2023-02-28 – 2023-03-05 (×10): 500 mg via ORAL
  Filled 2023-02-28 (×10): qty 1

## 2023-02-28 MED ORDER — METOPROLOL SUCCINATE ER 25 MG PO TB24
25.0000 mg | ORAL_TABLET | Freq: Every day | ORAL | Status: DC
  Administered 2023-03-01 – 2023-03-05 (×5): 25 mg via ORAL
  Filled 2023-02-28 (×5): qty 1

## 2023-02-28 MED ORDER — SODIUM CHLORIDE 0.9 % IV BOLUS
1000.0000 mL | Freq: Once | INTRAVENOUS | Status: AC
  Administered 2023-02-28: 1000 mL via INTRAVENOUS

## 2023-02-28 MED ORDER — SODIUM CHLORIDE 0.9 % IV SOLN: Freq: Once | INTRAVENOUS | Status: AC

## 2023-02-28 MED ORDER — ONDANSETRON HCL 4 MG/2ML IJ SOLN: 4.0000 mg | Freq: Four times a day (QID) | INTRAMUSCULAR | Status: DC | PRN

## 2023-02-28 MED ORDER — INSULIN ASPART 100 UNIT/ML IJ SOLN: 0.0000 [IU] | Freq: Three times a day (TID) | INTRAMUSCULAR | Status: DC

## 2023-02-28 MED ORDER — ACETAMINOPHEN 650 MG RE SUPP: 650.0000 mg | Freq: Four times a day (QID) | RECTAL | Status: DC | PRN

## 2023-02-28 MED ORDER — LACTATED RINGERS IV SOLN: INTRAVENOUS | Status: AC

## 2023-02-28 MED ORDER — NICOTINE 14 MG/24HR TD PT24: 14.0000 mg | MEDICATED_PATCH | Freq: Every day | TRANSDERMAL | Status: DC | PRN

## 2023-02-28 MED ORDER — ACETAMINOPHEN 325 MG PO TABS: 650.0000 mg | ORAL_TABLET | Freq: Four times a day (QID) | ORAL | Status: DC | PRN

## 2023-02-28 NOTE — ED Provider Notes (Signed)
[facility] AT [facility] Provider Note   CSN: [id number] Arrival date & time: 02/28/23  1235     History  Chief Complaint  Patient presents with   Rash   Weakness    Carolyn Jimenez is a 77 y.o. female.  Patient is brought to the emergency department by her son.  He is concerned that patient has increased weakness.  He reports that patient has been exposed to black mold and has multiple bedbug bites all over her body.  He reports patient has not been eating or drinking well. Pt's son does not live with her.  He reports he ws checking on her and noted the decline in her health.  Pt's son reports pt has recently had diarrhea .  The history is provided by the patient and a relative. No language interpreter was used.  Rash Location:  Full body Severity:  Moderate Duration:  1 week Timing:  Constant Progression:  Worsening Chronicity:  New Context: insect bite/sting   Relieved by:  Nothing Worsened by:  Nothing Associated symptoms: fatigue   Associated symptoms: no abdominal pain   Weakness Associated symptoms: no abdominal pain        Home Medications Prior to Admission medications   Medication Sig Start Date End Date Taking? Authorizing Provider  amLODipine (NORVASC) 10 MG tablet Take 10 mg by mouth daily.    [provider]  ARIPiprazole (ABILIFY) 2 MG tablet Take 1/2 tablet daily for one week then take 1 tablet daily 03/11/19   [provider]  aspirin 81 MG chewable tablet Chew by mouth daily.    [provider]  benazepril (LOTENSIN) 40 MG tablet Take 40 mg by mouth daily.    [provider]  gabapentin (NEURONTIN) 300 MG capsule Take 300 mg by mouth 3 (three) times daily.     [provider]  Hyoscyamine Sulfate SL (LEVSIN/SL) 0.125 MG SUBL Place 0.125 tablets under the tongue 3 (three) times daily as needed. 03/26/19   Arby Barrette, MD  levETIRAcetam (KEPPRA) 500 MG tablet Take 500 mg by  mouth 2 (two) times daily.    [provider]  loperamide (IMODIUM) 2 MG capsule Take 2 capsules (4 mg total) by mouth at bedtime as needed for diarrhea or loose stools. 08/08/18   Derwood Kaplan, MD  metoprolol succinate (TOPROL-XL) 25 MG 24 hr tablet Take 25 mg by mouth daily.    [provider]  ondansetron (ZOFRAN ODT) 8 MG disintegrating tablet Take 1 tablet (8 mg total) by mouth every 8 (eight) hours as needed for nausea. 08/08/18   Derwood Kaplan, MD  pantoprazole (PROTONIX) 40 MG tablet Take 40 mg by mouth 2 (two) times daily.    [provider]  rosuvastatin (CRESTOR) 20 MG tablet Take 20 mg by mouth daily.    [provider]  sertraline (ZOLOFT) 50 MG tablet Take 50 mg by mouth 2 (two) times daily. 1 tablet in the morning and 1/2 tablet at night    [provider]  sucralfate (CARAFATE) 1 GM/10ML suspension Take 10 mLs (1 g total) by mouth 4 (four) times daily -  with meals and at bedtime. 03/26/19   Arby Barrette, MD  traZODone (DESYREL) 50 MG tablet Take  2 tabs at night 12/14/18   [provider]  triamterene-hydrochlorothiazide (DYAZIDE) 37.5-25 MG capsule Take 1 capsule by mouth daily.    [provider]      Allergies    Codeine, Latex, and  Sulfa antibiotics    Review of Systems   Review of Systems  Constitutional:  Positive for fatigue.  Gastrointestinal:  Negative for abdominal pain.  Skin:  Positive for rash.  Neurological:  Positive for weakness.  All other systems reviewed and are negative.   Physical Exam Updated Vital Signs BP (!) 120/49   Pulse 80   Temp 98 F (36.7 C) (Oral)   Resp 16   Wt 84.7 kg   LMP  (LMP Unknown)   SpO2 100%   BMI 32.05 kg/m  Physical Exam Vitals and nursing note reviewed.  Constitutional:      Appearance: She is well-developed.  HENT:     Head: Normocephalic.  Cardiovascular:     Rate and Rhythm: Normal rate.  Pulmonary:     Effort: Pulmonary effort is normal.   Abdominal:     General: There is no distension.  Musculoskeletal:        General: Normal range of motion.     Cervical back: Normal range of motion.  Skin:    General: Skin is warm.     Comments: Multiple dried scabed areas full body   Neurological:     General: No focal deficit present.     Mental Status: She is alert and oriented to person, place, and time.  Psychiatric:        Mood and Affect: Mood normal.     ED Results / Procedures / Treatments   Labs (all labs ordered are listed, but only abnormal results are displayed) Labs Reviewed  CBC WITH DIFFERENTIAL/PLATELET - Abnormal; Notable for the following components:      Result Value   WBC 11.1 (*)    RBC 3.16 (*)    Hemoglobin 8.4 (*)    HCT 26.1 (*)    RDW 15.9 (*)    Neutro Abs 9.3 (*)    Abs Immature Granulocytes 0.08 (*)    All other components within normal limits  COMPREHENSIVE METABOLIC PANEL - Abnormal; Notable for the following components:   Sodium 133 (*)    Potassium 2.9 (*)    CO2 17 (*)    Glucose, Bld 104 (*)    BUN 42 (*)    Creatinine, Ser 3.22 (*)    Calcium 8.4 (*)    Albumin 2.8 (*)    AST 57 (*)    ALT 57 (*)    GFR, Estimated 14 (*)    All other components within normal limits  URINALYSIS, ROUTINE W REFLEX MICROSCOPIC    EKG EKG Interpretation Date/Time:  Friday February 28 2023 12:50:10 EDT Ventricular Rate:  102 PR Interval:  126 QRS Duration:  92 QT Interval:  343 QTC Calculation: 447 R Axis:   -12  Text Interpretation: Sinus tachycardia Nonspecific T abnrm, anterolateral leads Confirmed by Alvino Blood ([id number]) on 02/28/2023 3:04:28 PM  Radiology No results found.  Procedures Procedures    Medications Ordered in ED Medications  sodium chloride 0.9 % bolus 1,000 mL (1,000 mLs Intravenous New Bag/Given 02/28/23 1437)    ED Course/ Medical Decision Making/ A&P                             Medical Decision Making Pt complains of increased weakness.  Son reports pt is  not eating or drinking well.  He reports pt has been exposed to mold in the home and has bedbugs which have been biting her   Amount and/or Complexity of  Data Reviewed Labs: ordered. Decision-making details documented in ED Course.    Details: Labs ordered reviewed and interpreted.  White blood cell count is 11.1 hemoglobin is 8.4 hematocrit is 26.1.  Patient's potassium is 2.9.  BUN is 42 creatinine is 3.22.  Patient's GFR is less than 14. Discussion of management or test interpretation with external provider(s): Hospitalist consulted for admission           Final Clinical Impression(s) / ED Diagnoses Final diagnoses:  Failure to thrive in adult  Acute kidney injury (HCC)  Anemia, unspecified type  Rash    Rx / DC Orders ED Discharge Orders     None         Osie Cheeks 02/28/23 1615    Lonell Grandchild, MD 03/01/23 1450

## 2023-02-28 NOTE — Plan of Care (Signed)
77 year old F with PMH of psychosis, anxiety, depression, chronic back pain, HTN, GERD, breast cancer, tobacco and marijuana use.  Per ED PA, Trisha Mangle, patient was brought to ED by his son after found to have weakness, diarrhea, bedbugs and scabbed wounds likely from bedbugs.  She also had poor p.o. intake.  Lives alone.  Patient's son checks on her.  No bowel movement in ED.  Low suspicion for C. difficile.  In ED, stable vitals.  100% on RA.  WBC 11.1 with left shift.  Hgb 8.4 (10.8 in 2020). Na 133.  K2.9. Cr 3.22 (1.1 in 2020).  BUN 42.  CO2 17.  AST 57.  ALT 57.  Total bili and ALP normal.  Received 1 L NS bolus.  P.o. KCl 40 mill EQ was not ordered but patient was not able to take p.o.  IV KCl 10 mEq x 3 ordered.  Hospitalist service consulted for admission for AKI, hypokalemia, generalized weakness...  Accepted to telemetry bed. Added Mg and CK.  Recommended renal US.

## 2023-02-28 NOTE — H&P (Signed)
History and Physical      Carolyn Jimenez ZOX:[id number] DOB: 08/12/1946 DOA: 02/28/2023; DOS: 02/28/2023  PCP: Carolyn Cedar, NP *** Patient coming from: home ***  I have personally briefly reviewed patient's old medical records in [facility]  Chief Complaint: ***  HPI: Carolyn Jimenez is a 77 y.o. female with medical history significant for *** who is admitted to [facility] on 02/28/2023 with *** after presenting from home*** to [facility] ED complaining of ***.   ***        ***  ED Course:  Vital signs in the ED were notable for the following: ***  Labs were notable for the following: ***  Per my interpretation, EKG in ED demonstrated the following:  ***  Imaging and additional notable ED work-up: ***  While in the ED, the following were administered: ***  Subsequently, the patient was admitted  ***  ***red   Review of Systems: As per HPI otherwise 10 point review of systems negative.   Past Medical History:  Diagnosis Date   Breast cancer (HCC)    Diabetes mellitus without complication (HCC)    High cholesterol    Hypertension    Seizures (HCC)     Past Surgical History:  Procedure Laterality Date   BACK SURGERY     CHOLECYSTECTOMY      Social History:  reports that she has been smoking cigarettes. She has never used smokeless tobacco. She reports that she does not drink alcohol and does not use drugs.   Allergies  Allergen Reactions   Codeine    Latex    Sulfa Antibiotics     History reviewed. No pertinent family history.  Family history reviewed and not pertinent ***   Prior to Admission medications   Medication Sig Start Date End Date Taking? Authorizing Provider  amLODipine (NORVASC) 10 MG tablet Take 10 mg by mouth daily.    [provider]  ARIPiprazole (ABILIFY) 2 MG tablet Take 1/2 tablet daily for one week then take 1 tablet daily 03/11/19   [provider]  aspirin 81 MG chewable tablet Chew by mouth  daily.    [provider]  benazepril (LOTENSIN) 40 MG tablet Take 40 mg by mouth daily.    [provider]  gabapentin (NEURONTIN) 300 MG capsule Take 300 mg by mouth 3 (three) times daily.     [provider]  Hyoscyamine Sulfate SL (LEVSIN/SL) 0.125 MG SUBL Place 0.125 tablets under the tongue 3 (three) times daily as needed. 03/26/19   Arby Barrette, MD  levETIRAcetam (KEPPRA) 500 MG tablet Take 500 mg by mouth 2 (two) times daily.    [provider]  loperamide (IMODIUM) 2 MG capsule Take 2 capsules (4 mg total) by mouth at bedtime as needed for diarrhea or loose stools. 08/08/18   Derwood Kaplan, MD  metoprolol succinate (TOPROL-XL) 25 MG 24 hr tablet Take 25 mg by mouth daily.    [provider]  ondansetron (ZOFRAN ODT) 8 MG disintegrating tablet Take 1 tablet (8 mg total) by mouth every 8 (eight) hours as needed for nausea. 08/08/18   Derwood Kaplan, MD  pantoprazole (PROTONIX) 40 MG tablet Take 40 mg by mouth 2 (two) times daily.    [provider]  rosuvastatin (CRESTOR) 20 MG tablet Take 20 mg by mouth daily.    [provider]  sertraline (ZOLOFT) 50 MG tablet Take 50 mg by mouth 2 (two) times daily. 1 tablet in the morning and  1/2 tablet at night    [provider]  sucralfate (CARAFATE) 1 GM/10ML suspension Take 10 mLs (1 g total) by mouth 4 (four) times daily -  with meals and at bedtime. 03/26/19   Arby Barrette, MD  traZODone (DESYREL) 50 MG tablet Take  2 tabs at night 12/14/18   [provider]  triamterene-hydrochlorothiazide (DYAZIDE) 37.5-25 MG capsule Take 1 capsule by mouth daily.    [provider]     Objective    Physical Exam: Vitals:   02/28/23 1500 02/28/23 1600 02/28/23 1646 02/28/23 1839  BP: (!) 120/49 (!) 127/53 (!) 126/59 (!) 124/51  Pulse: 80 88 90 78  Resp: 16 (!) 23 18 16   Temp:   99 F (37.2 C) 98.2 F (36.8 C)  TempSrc:   Oral Oral  SpO2: 100% 97% 98% 97%   Weight:        General: appears to be stated age; alert, oriented Skin: warm, dry, no rash Head:  AT/[place] Mouth:  Oral mucosa membranes appear moist, normal dentition Neck: supple; trachea midline Heart:  RRR; did not appreciate any M/R/G Lungs: CTAB, did not appreciate any wheezes, rales, or rhonchi Abdomen: + BS; soft, ND, NT Vascular: 2+ pedal pulses b/l; 2+ radial pulses b/l Extremities: no peripheral edema, no muscle wasting Neuro: strength and sensation intact in upper and lower extremities b/l    *** Neuro: 5/5 strength of the proximal and distal flexors and extensors of the upper and lower extremities bilaterally; sensation intact in upper and lower extremities b/l; cranial nerves II through XII grossly intact; no pronator drift; no evidence suggestive of slurred speech, dysarthria, or facial droop; Normal muscle tone. No tremors. *** Neuro: In the setting of the patient's current mental status and associated inability to follow instructions, unable to perform full neurologic exam at this time.  As such, assessment of strength, sensation, and cranial nerves is limited at this time. Patient noted to spontaneously move all 4 extremities. No tremors.  ***    Labs on Admission: I have personally reviewed following labs and imaging studies  CBC: Recent Labs  Lab 02/28/23 1332  WBC 11.1*  NEUTROABS 9.3*  HGB 8.4*  HCT 26.1*  MCV 82.6  PLT 301   Basic Metabolic Panel: Recent Labs  Lab 02/28/23 1332 02/28/23 1406  NA 133*  --   K 2.9*  --   CL 102  --   CO2 17*  --   GLUCOSE 104*  --   BUN 42*  --   CREATININE 3.22*  --   CALCIUM 8.4*  --   MG  --  1.8   GFR: CrCl cannot be calculated (Unknown ideal weight.). Liver Function Tests: Recent Labs  Lab 02/28/23 1332  AST 57*  ALT 57*  ALKPHOS 69  BILITOT 0.8  PROT 7.9  ALBUMIN 2.8*   No results for input(s): "LIPASE", "AMYLASE" in the last 168 hours. No results for input(s): "AMMONIA" in the last 168  hours. Coagulation Profile: No results for input(s): "INR", "PROTIME" in the last 168 hours. Cardiac Enzymes: Recent Labs  Lab 02/28/23 1406  CKTOTAL 180   BNP (last 3 results) No results for input(s): "PROBNP" in the last 8760 hours. HbA1C: No results for input(s): "HGBA1C" in the last 72 hours. CBG: No results for input(s): "GLUCAP" in the last 168 hours. Lipid Profile: No results for input(s): "CHOL", "HDL", "LDLCALC", "TRIG", "CHOLHDL", "LDLDIRECT" in the last 72 hours. Thyroid Function Tests: No results for input(s): "TSH", "T4TOTAL", "  FREET4", "T3FREE", "THYROIDAB" in the last 72 hours. Anemia Panel: No results for input(s): "VITAMINB12", "FOLATE", "FERRITIN", "TIBC", "IRON", "RETICCTPCT" in the last 72 hours. Urine analysis:    Component Value Date/Time   COLORURINE AMBER (A) 02/28/2023 1705   APPEARANCEUR CLOUDY (A) 02/28/2023 1705   LABSPEC 1.025 02/28/2023 1705   PHURINE 5.5 02/28/2023 1705   GLUCOSEU NEGATIVE 02/28/2023 1705   HGBUR MODERATE (A) 02/28/2023 1705   BILIRUBINUR SMALL (A) 02/28/2023 1705   KETONESUR NEGATIVE 02/28/2023 1705   PROTEINUR >=300 (A) 02/28/2023 1705   NITRITE NEGATIVE 02/28/2023 1705   LEUKOCYTESUR NEGATIVE 02/28/2023 1705    Radiological Exams on Admission: US Renal  Result Date: 02/28/2023 CLINICAL DATA:  Acute kidney injury EXAM: RENAL / URINARY TRACT ULTRASOUND COMPLETE COMPARISON:  CT 03/26/2019 FINDINGS: Right Kidney: Renal measurements: 10 x 5.1 x 6.3 cm = volume: 167 mL. Slightly echogenic cortex. No mass or hydronephrosis Left Kidney: Renal measurements: 10.3 x 6.2 x 5.2 cm = volume: 174 mL. Slightly echogenic cortex. No mass or hydronephrosis. Bladder: Nearly empty Other: None. IMPRESSION: Slightly echogenic renal cortex bilaterally, may be seen with medical renal disease. No hydronephrosis. Electronically Signed   By: Jasmine Pang M.D.   On: 02/28/2023 17:54      Assessment/Plan    Principal Problem:   AKI (acute kidney  injury) (HCC)  ***      ***          ***               ***              ***              ***              ***              ***              ***              ***              ***              ***              ***              ***     DVT prophylaxis: SCD's ***  Code Status: Full code*** Family Communication: none*** Disposition Plan: Per Rounding Team Consults called: none***;  Admission status: ***    I SPENT GREATER THAN 75 *** MINUTES IN CLINICAL CARE TIME/MEDICAL DECISION-MAKING IN COMPLETING THIS ADMISSION.     Chaney Born Cristopher Ciccarelli DO [facility] From 7PM - 7AM   02/28/2023, 7:56 PM   ***

## 2023-02-28 NOTE — ED Triage Notes (Signed)
Patients son stated that she has bedbugs at home. She has what appears to be wounds all over her body. Her son is concerned they are bites from the bed bugs. She is very weak. She is having diarrhea.

## 2023-03-01 DIAGNOSIS — E119 Type 2 diabetes mellitus without complications: Secondary | ICD-10-CM

## 2023-03-01 DIAGNOSIS — E871 Hypo-osmolality and hyponatremia: Secondary | ICD-10-CM | POA: Diagnosis present

## 2023-03-01 DIAGNOSIS — E860 Dehydration: Secondary | ICD-10-CM | POA: Diagnosis present

## 2023-03-01 DIAGNOSIS — R531 Weakness: Secondary | ICD-10-CM

## 2023-03-01 DIAGNOSIS — I10 Essential (primary) hypertension: Secondary | ICD-10-CM | POA: Diagnosis present

## 2023-03-01 DIAGNOSIS — N179 Acute kidney failure, unspecified: Secondary | ICD-10-CM | POA: Diagnosis not present

## 2023-03-01 DIAGNOSIS — E876 Hypokalemia: Secondary | ICD-10-CM | POA: Diagnosis present

## 2023-03-01 DIAGNOSIS — D649 Anemia, unspecified: Secondary | ICD-10-CM | POA: Diagnosis present

## 2023-03-01 LAB — COMPREHENSIVE METABOLIC PANEL
ALT: 44 U/L (ref 0–44)
AST: 46 U/L — ABNORMAL HIGH (ref 15–41)
Albumin: 2.4 g/dL — ABNORMAL LOW (ref 3.5–5.0)
Alkaline Phosphatase: 65 U/L (ref 38–126)
Anion gap: 11 (ref 5–15)
BUN: 42 mg/dL — ABNORMAL HIGH (ref 8–23)
CO2: 15 mmol/L — ABNORMAL LOW (ref 22–32)
Calcium: 8.2 mg/dL — ABNORMAL LOW (ref 8.9–10.3)
Chloride: 110 mmol/L (ref 98–111)
Creatinine, Ser: 3.48 mg/dL — ABNORMAL HIGH (ref 0.44–1.00)
GFR, Estimated: 13 mL/min — ABNORMAL LOW (ref >60)
Glucose, Bld: 78 mg/dL (ref 70–99)
Potassium: 3.4 mmol/L — ABNORMAL LOW (ref 3.5–5.1)
Sodium: 136 mmol/L (ref 135–145)
Total Bilirubin: 0.8 mg/dL (ref 0.3–1.2)
Total Protein: 7.2 g/dL (ref 6.5–8.1)

## 2023-03-01 LAB — IRON AND TIBC
Iron: 37 ug/dL (ref 28–170)
Saturation Ratios: 18 % (ref 10.4–31.8)
TIBC: 203 ug/dL — ABNORMAL LOW (ref 250–450)
UIBC: 166 ug/dL

## 2023-03-01 LAB — VITAMIN B12: Vitamin B-12: 506 pg/mL (ref 180–914)

## 2023-03-01 LAB — TYPE AND SCREEN
ABO/RH(D): B POS
Antibody Screen: NEGATIVE

## 2023-03-01 LAB — RETICULOCYTES
Immature Retic Fract: 20.3 % — ABNORMAL HIGH (ref 2.3–15.9)
RBC.: 2.83 MIL/uL — ABNORMAL LOW (ref 3.87–5.11)
Retic Count, Absolute: 37.6 10*3/uL (ref 19.0–186.0)
Retic Ct Pct: 1.3 % (ref 0.4–3.1)

## 2023-03-01 LAB — TSH: TSH: 1.045 u[IU]/mL (ref 0.350–4.500)

## 2023-03-01 LAB — CBC WITH DIFFERENTIAL/PLATELET
Abs Immature Granulocytes: 0.15 10*3/uL — ABNORMAL HIGH (ref 0.00–0.07)
Basophils Absolute: 0 10*3/uL (ref 0.0–0.1)
Basophils Relative: 0 %
Eosinophils Absolute: 0.1 10*3/uL (ref 0.0–0.5)
Eosinophils Relative: 1 %
HCT: 24.6 % — ABNORMAL LOW (ref 36.0–46.0)
Hemoglobin: 7.6 g/dL — ABNORMAL LOW (ref 12.0–15.0)
Immature Granulocytes: 2 %
Lymphocytes Relative: 11 %
Lymphs Abs: 1 10*3/uL (ref 0.7–4.0)
MCH: 26.5 pg (ref 26.0–34.0)
MCHC: 30.9 g/dL (ref 30.0–36.0)
MCV: 85.7 fL (ref 80.0–100.0)
Monocytes Absolute: 0.5 10*3/uL (ref 0.1–1.0)
Monocytes Relative: 5 %
Neutro Abs: 7.9 10*3/uL — ABNORMAL HIGH (ref 1.7–7.7)
Neutrophils Relative %: 81 %
Platelets: 239 10*3/uL (ref 150–400)
RBC: 2.87 MIL/uL — ABNORMAL LOW (ref 3.87–5.11)
RDW: 16.1 % — ABNORMAL HIGH (ref 11.5–15.5)
WBC: 9.7 10*3/uL (ref 4.0–10.5)
nRBC: 0 % (ref 0.0–0.2)

## 2023-03-01 LAB — CREATININE, URINE, RANDOM: Creatinine, Urine: 64 mg/dL

## 2023-03-01 LAB — PROCALCITONIN: Procalcitonin: 0.81 ng/mL

## 2023-03-01 LAB — GLUCOSE, CAPILLARY
Glucose-Capillary: 92 mg/dL (ref 70–99)
Glucose-Capillary: 124 mg/dL — ABNORMAL HIGH (ref 70–99)
Glucose-Capillary: 122 mg/dL — ABNORMAL HIGH (ref 70–99)
Glucose-Capillary: 118 mg/dL — ABNORMAL HIGH (ref 70–99)

## 2023-03-01 LAB — MAGNESIUM: Magnesium: 1.8 mg/dL (ref 1.7–2.4)

## 2023-03-01 LAB — RAPID URINE DRUG SCREEN, HOSP PERFORMED
Amphetamines: NOT DETECTED
Barbiturates: NOT DETECTED
Benzodiazepines: NOT DETECTED
Cocaine: NOT DETECTED
Opiates: NOT DETECTED
Tetrahydrocannabinol: NOT DETECTED

## 2023-03-01 LAB — FOLATE: Folate: 8 ng/mL (ref >5.9)

## 2023-03-01 LAB — ABO/RH: ABO/RH(D): B POS

## 2023-03-01 LAB — SODIUM, URINE, RANDOM: Sodium, Ur: 59 mmol/L

## 2023-03-01 LAB — PROTIME-INR
INR: 1.1 (ref 0.8–1.2)
Prothrombin Time: 14.8 seconds (ref 11.4–15.2)

## 2023-03-01 LAB — HEPATITIS PANEL, ACUTE
HCV Ab: NONREACTIVE
Hep A IgM: NONREACTIVE
Hep B C IgM: NONREACTIVE
Hepatitis B Surface Ag: NONREACTIVE

## 2023-03-01 LAB — OSMOLALITY: Osmolality: 307 mOsm/kg — ABNORMAL HIGH (ref 275–295)

## 2023-03-01 LAB — FERRITIN: Ferritin: 581 ng/mL — ABNORMAL HIGH (ref 11–307)

## 2023-03-01 MED ORDER — BUSPIRONE HCL 5 MG PO TABS
15.0000 mg | ORAL_TABLET | Freq: Two times a day (BID) | ORAL | Status: DC
  Administered 2023-03-01 – 2023-03-05 (×8): 15 mg via ORAL
  Filled 2023-03-01 (×8): qty 1

## 2023-03-01 MED ORDER — SERTRALINE HCL 25 MG PO TABS: 25.0000 mg | ORAL_TABLET | Freq: Two times a day (BID) | ORAL | Status: DC

## 2023-03-01 MED ORDER — SODIUM BICARBONATE 8.4 % IV SOLN
INTRAVENOUS | Status: DC
  Filled 2023-03-01: qty 150
  Filled 2023-03-01: qty 1000

## 2023-03-01 MED ORDER — SERTRALINE HCL 50 MG PO TABS
50.0000 mg | ORAL_TABLET | Freq: Every day | ORAL | Status: DC
  Administered 2023-03-02 – 2023-03-05 (×4): 50 mg via ORAL
  Filled 2023-03-01 (×4): qty 1

## 2023-03-01 MED ORDER — SERTRALINE HCL 25 MG PO TABS
25.0000 mg | ORAL_TABLET | Freq: Every day | ORAL | Status: DC
  Administered 2023-03-01 – 2023-03-04 (×4): 25 mg via ORAL
  Filled 2023-03-01 (×4): qty 1

## 2023-03-01 MED ORDER — ASPIRIN 81 MG PO CHEW
81.0000 mg | CHEWABLE_TABLET | Freq: Every day | ORAL | Status: DC
  Administered 2023-03-01 – 2023-03-05 (×5): 81 mg via ORAL
  Filled 2023-03-01 (×5): qty 1

## 2023-03-01 MED ORDER — POTASSIUM CHLORIDE CRYS ER 20 MEQ PO TBCR
30.0000 meq | EXTENDED_RELEASE_TABLET | Freq: Once | ORAL | Status: AC
  Administered 2023-03-01: 30 meq via ORAL
  Filled 2023-03-01: qty 1

## 2023-03-01 MED ORDER — OLANZAPINE 10 MG PO TABS
10.0000 mg | ORAL_TABLET | Freq: Every day | ORAL | Status: DC
  Administered 2023-03-01 – 2023-03-04 (×4): 10 mg via ORAL
  Filled 2023-03-01 (×4): qty 1

## 2023-03-01 MED ORDER — ROSUVASTATIN CALCIUM 10 MG PO TABS
20.0000 mg | ORAL_TABLET | Freq: Every day | ORAL | Status: DC
  Administered 2023-03-02 – 2023-03-05 (×4): 20 mg via ORAL
  Filled 2023-03-01 (×4): qty 2

## 2023-03-01 NOTE — TOC Progression Note (Signed)
Transition of Care [facility]) - Progression Note    Patient Details  Name: Carolyn Jimenez MRN: [id number] Date of Birth: [birth year]  [facility] of [facility] CM/SW Contact  Georgie Chard, LCSW Phone Number: 03/01/2023, 11:57 AM  Clinical Narrative:    CSW has reached out to patient at this time Patient has declined. There are no further TOC needs at this time.         Expected Discharge Plan and Services                                               Social Determinants of Health (SDOH) Interventions SDOH Screenings   Food Insecurity: No Food Insecurity (02/28/2023)  Housing: Low Risk  (02/28/2023)  Transportation Needs: No Transportation Needs (02/28/2023)  Utilities: Not At Risk (02/28/2023)  Tobacco Use: High Risk (02/28/2023)    Readmission Risk Interventions     No data to display

## 2023-03-01 NOTE — Progress Notes (Addendum)
PROGRESS NOTE  Carolyn Jimenez GNF:[id number] DOB: 27-Dec-1945 DOA: 02/28/2023 PCP: Cleopatra Cedar, NP   LOS: 1 day   Brief Narrative / Interim history: 77 year old female with history of DM2, HTN, iron deficiency anemia who comes into the hospital with generalized weakness.  She tells me this has been going on for the past 3 to 4 days, associated with poor p.o. intake as well as diarrhea over the same amount of time.  She denies any fever or chills, no blood in her stool.  She denies any nausea, vomiting.  There is no melena.  In the ER she was found to have acute kidney injury with a creatinine of 3.2 compared to 0.10 September 2022,  Subjective / 24h Interval events: Continues to complain of weakness this morning, reports multiple bug bites throughout her body, and reports that she has bedbugs at home  Assesement and Plan: Principal Problem:   Acute kidney injury (HCC) Active Problems:   Hypokalemia   Dehydration   Generalized weakness   Acute hyponatremia   Acute on chronic anemia   Essential hypertension   DM2 (diabetes mellitus, type 2) (HCC)   Principal problem Acute kidney injury -creatinine remains poor this morning, in fact slightly worse at 3.4.  Resume IV fluids, this is likely due to poor p.o. intake. -Placed on sodium bicarb infusion -Renal ultrasound with medical renal disease, no hydronephrosis or any other acute findings -Hold ACEI  Active problems Essential hypertension -blood pressure normal, given acute kidney injury hold home agents.  She is on amlodipine, benazepril  Diarrhea-check C. difficile and GI pathogen panel  Iron deficiency anemia, acute on chronic -hemoglobin likely worsened due to acute kidney injury/renal disease.  Hyponatremia-sodium normalized with fluids  Hypokalemia-replace potassium, magnesium normal at 1.8  Non-anion gap metabolic acidosis-on sodium bicarb infusion, due to AKI  Type 2 diabetes mellitus-continue sliding scale, hold home  metformin  CBG (last 3)  Recent Labs    02/28/23 2250 03/01/23 0906  GLUCAP 95 92   Depression-continue Zoloft  History of seizure disorder-continue Keppra  Scheduled Meds:  insulin aspart  0-6 Units Subcutaneous TID WC   levETIRAcetam  500 mg Oral BID   metoprolol succinate  25 mg Oral Daily   Continuous Infusions:  sodium bicarbonate 150 mEq in dextrose 5 % 1,150 mL infusion     PRN Meds:.acetaminophen **OR** acetaminophen, melatonin, nicotine, ondansetron (ZOFRAN) IV  Current Outpatient Medications  Medication Instructions   acetaminophen (TYLENOL) 500 mg, Oral, As needed   amLODipine (NORVASC) 10 mg, Oral, Daily   aspirin 81 MG chewable tablet Oral, Daily   benazepril (LOTENSIN) 40 mg, Oral, Daily   busPIRone (BUSPAR) 15 mg, Oral, 2 times daily   diclofenac Sodium (VOLTAREN) 2 g, Topical, 4 times daily   gabapentin (NEURONTIN) 300 mg, Oral, 3 times daily   latanoprost (XALATAN) 0.005 % ophthalmic solution 1 drop, Both Eyes, Daily at bedtime   levETIRAcetam (KEPPRA) 500 mg, Oral, 2 times daily   metFORMIN (GLUCOPHAGE) 500 mg, Oral, 2 times daily with meals   metoprolol succinate (TOPROL-XL) 25 mg, Oral, Daily   OLANZapine (ZYPREXA) 10 mg, Oral, Daily at bedtime   Ozempic (0.25 or 0.5 MG/DOSE) 0.25 mg, Subcutaneous, Weekly   rosuvastatin (CRESTOR) 20 mg, Oral, Daily   sertraline (ZOLOFT) 50 mg, Oral, 2 times daily, 1 tablet in the morning and 1/2 tablet at night     Diet Orders (From admission, onward)     Start     Ordered   02/28/23 1919  Diet regular Room service appropriate? Yes; Fluid consistency: Thin  Diet effective now       Question Answer Comment  Room service appropriate? Yes   Fluid consistency: Thin      02/28/23 1918            DVT prophylaxis: SCDs Start: 02/28/23 1918   Lab Results  Component Value Date   PLT 239 03/01/2023      Code Status: Full Code  Family Communication: no family at bedside   Status is: Inpatient Remains  inpatient appropriate because: AKI   Level of care: Telemetry  Consultants:    Objective: Vitals:   02/28/23 1839 02/28/23 1900 02/28/23 2253 03/01/23 0242  BP: (!) 124/51  (!) 115/58 (!) 121/55  Pulse: 78  80 88  Resp: 16  20 18   Temp: 98.2 F (36.8 C)  98.2 F (36.8 C)   TempSrc: Oral  Oral   SpO2: 97%  98% 100%  Weight:  84.7 kg    Height:  5\' 4"  (1.626 m)      Intake/Output Summary (Last 24 hours) at 03/01/2023 1140 Last data filed at 03/01/2023 0404 Gross per 24 hour  Intake 2786.2 ml  Output --  Net 2786.2 ml   Wt Readings from Last 3 Encounters:  02/28/23 84.7 kg  03/26/19 84.7 kg  08/07/18 71.2 kg    Examination:  Constitutional: NAD Eyes: no scleral icterus ENMT: Mucous membranes are moist.  Neck: normal, supple Respiratory: clear to auscultation bilaterally, no wheezing, no crackles.  Cardiovascular: Regular rate and rhythm, no murmurs / rubs / gallops.  Abdomen: non distended, no tenderness. Bowel sounds positive.  Musculoskeletal: no clubbing / cyanosis.    Data Reviewed: I have independently reviewed following labs and imaging studies   CBC Recent Labs  Lab 02/28/23 1332 03/01/23 0732  WBC 11.1* 9.7  HGB 8.4* 7.6*  HCT 26.1* 24.6*  PLT 301 239  MCV 82.6 85.7  MCH 26.6 26.5  MCHC 32.2 30.9  RDW 15.9* 16.1*  LYMPHSABS 1.0 1.0  MONOABS 0.6 0.5  EOSABS 0.1 0.1  BASOSABS 0.0 0.0    Recent Labs  Lab 02/28/23 1332 02/28/23 1406 03/01/23 0732  NA 133*  --  136  K 2.9*  --  3.4*  CL 102  --  110  CO2 17*  --  15*  GLUCOSE 104*  --  78  BUN 42*  --  42*  CREATININE 3.22*  --  3.48*  CALCIUM 8.4*  --  8.2*  AST 57*  --  46*  ALT 57*  --  44  ALKPHOS 69  --  65  BILITOT 0.8  --  0.8  ALBUMIN 2.8*  --  2.4*  MG  --  1.8 1.8  PROCALCITON  --   --  0.81  INR  --   --  1.1  TSH  --   --  1.045    ------------------------------------------------------------------------------------------------------------------ No results for  input(s): "CHOL", "HDL", "LDLCALC", "TRIG", "CHOLHDL", "LDLDIRECT" in the last 72 hours.  No results found for: "HGBA1C" ------------------------------------------------------------------------------------------------------------------ Recent Labs    03/01/23 0732  TSH 1.045    Cardiac Enzymes No results for input(s): "CKMB", "TROPONINI", "MYOGLOBIN" in the last 168 hours.  Invalid input(s): "CK" ------------------------------------------------------------------------------------------------------------------ No results found for: "BNP"  CBG: Recent Labs  Lab 02/28/23 2250 03/01/23 0906  GLUCAP 95 92    No results found for this or any previous visit (from the past 240 hour(s)).   Radiology Studies: DG Chest  Port 1 View  Result Date: 02/28/2023 CLINICAL DATA:  Generalized weakness EXAM: PORTABLE CHEST 1 VIEW COMPARISON:  09/12/2022 FINDINGS: The heart size and mediastinal contours are within normal limits. Both lungs are clear. The visualized skeletal structures are unremarkable. IMPRESSION: No active disease. Electronically Signed   By: Ernie Avena M.D.   On: 02/28/2023 20:14   US Renal  Result Date: 02/28/2023 CLINICAL DATA:  Acute kidney injury EXAM: RENAL / URINARY TRACT ULTRASOUND COMPLETE COMPARISON:  CT 03/26/2019 FINDINGS: Right Kidney: Renal measurements: 10 x 5.1 x 6.3 cm = volume: 167 mL. Slightly echogenic cortex. No mass or hydronephrosis Left Kidney: Renal measurements: 10.3 x 6.2 x 5.2 cm = volume: 174 mL. Slightly echogenic cortex. No mass or hydronephrosis. Bladder: Nearly empty Other: None. IMPRESSION: Slightly echogenic renal cortex bilaterally, may be seen with medical renal disease. No hydronephrosis. Electronically Signed   By: Jasmine Pang M.D.   On: 02/28/2023 17:54     Pamella Pert, MD, PhD [facility]  Between 7 am - 7 pm I am available, please contact me via Amion (for emergencies) or Securechat (non urgent messages)  Between 7 pm  - 7 am I am not available, please contact night coverage MD/APP via Amion

## 2023-03-01 NOTE — Progress Notes (Signed)
Chart reviewing for order and orders in signed and held from 03/01/23 at 1230 today. Orders reconciled and released.

## 2023-03-01 NOTE — Plan of Care (Signed)

## 2023-03-01 NOTE — Evaluation (Signed)
Physical Therapy Evaluation Patient Details Name: Carolyn Jimenez MRN: [id number] DOB: 01-02-1946 Today's Date: 03/01/2023  History of Present Illness  Pt admitted from home 2* weakness and diarrhea and dx with AKI, hypokalemia, hyponatremia, dehydration and acute on chronic anemia.  Pt with hx of DM, breast CA, back surgery and seizures.  Clinical Impression  Pt admitted as above and presenting with functional mobility limitations 2* generalized weakness, balance deficits and decreased endurance.  Pt will benefit from acute stay PT and dependent on progress follow up PT to maximize IND and safety in next setting..     Recommendations for follow up therapy are one component of a multi-disciplinary discharge planning process, led by the attending physician.  Recommendations may be updated based on patient status, additional functional criteria and insurance authorization.  Follow Up Recommendations       Assistance Recommended at Discharge Intermittent Supervision/Assistance  Patient can return home with the following  A little help with walking and/or transfers;A little help with bathing/dressing/bathroom;Assistance with cooking/housework;Assist for transportation;Help with stairs or ramp for entrance    Equipment Recommendations Rolling walker (2 wheels)  Recommendations for Other Services       Functional Status Assessment Patient has had a recent decline in their functional status and demonstrates the ability to make significant improvements in function in a reasonable and predictable amount of time.     Precautions / Restrictions Precautions Precautions: Fall Restrictions Weight Bearing Restrictions: No      Mobility  Bed Mobility Overal bed mobility: Modified Independent             General bed mobility comments: To EOB sitting with increased time but no physical assist    Transfers Overall transfer level: Needs assistance   Transfers: Sit to/from Stand Sit to  Stand: Min assist           General transfer comment: cues for use of UEs to self assist and min assist to bring wt up and fwd to balance in standing with RW    Ambulation/Gait Ambulation/Gait assistance: Min assist, Min guard Gait Distance (Feet): 28 Feet Assistive device: Rolling walker (2 wheels) Gait Pattern/deviations: Decreased step length - right, Decreased step length - left, Step-to pattern, Step-through pattern, Shuffle, Trunk flexed Gait velocity: decr     General Gait Details: cues for posture, position from RW and safety awareness.  Distance ltd by fatigue.  Pt states feels much safer holding RW vs cane  Stairs            Wheelchair Mobility    Modified Rankin (Stroke Patients Only)       Balance Overall balance assessment: Needs assistance Sitting-balance support: Feet supported, No upper extremity supported Sitting balance-Leahy Scale: Good     Standing balance support: Bilateral upper extremity supported Standing balance-Leahy Scale: Fair                               Pertinent Vitals/Pain Pain Assessment Pain Assessment: No/denies pain    Home Living Family/patient expects to be discharged to:: Private residence Living Arrangements: Spouse/significant other;Other relatives Available Help at Discharge: Family;Available 24 hours/day Type of Home: House Home Access: Level entry       Home Layout: One level Home Equipment: Cane - single point Additional Comments: Husband works days and nephew works nights    Prior Function Prior Level of Function : Independent/Modified Independent  Mobility Comments: using cane at all times for mobility       Hand Dominance        Extremity/Trunk Assessment   Upper Extremity Assessment Upper Extremity Assessment: Generalized weakness    Lower Extremity Assessment Lower Extremity Assessment: Generalized weakness       Communication   Communication: No  difficulties  Cognition Arousal/Alertness: Awake/alert Behavior During Therapy: WFL for tasks assessed/performed, Flat affect Overall Cognitive Status: Within Functional Limits for tasks assessed                                          General Comments      Exercises General Exercises - Lower Extremity Ankle Circles/Pumps: AAROM, Both, 15 reps, Supine   Assessment/Plan    PT Assessment Patient needs continued PT services  PT Problem List Decreased strength;Decreased activity tolerance;Decreased balance;Decreased mobility;Decreased knowledge of use of DME;Decreased safety awareness       PT Treatment Interventions DME instruction;Gait training;Functional mobility training;Therapeutic activities;Therapeutic exercise;Patient/family education    PT Goals (Current goals can be found in the Care Plan section)  Acute Rehab PT Goals Patient Stated Goal: REgain IND PT Goal Formulation: With patient Time For Goal Achievement: 03/13/23 Potential to Achieve Goals: Fair    Frequency Min 1X/week     Co-evaluation               AM-PAC PT "6 Clicks" Mobility  Outcome Measure Help needed turning from your back to your side while in a flat bed without using bedrails?: None Help needed moving from lying on your back to sitting on the side of a flat bed without using bedrails?: None Help needed moving to and from a bed to a chair (including a wheelchair)?: A Little Help needed standing up from a chair using your arms (e.g., wheelchair or bedside chair)?: A Little Help needed to walk in hospital room?: A Little Help needed climbing 3-5 steps with a railing? : A Lot 6 Click Score: 19    End of Session Equipment Utilized During Treatment: Gait belt Activity Tolerance: Patient tolerated treatment well;Patient limited by fatigue Patient left: in chair;with call bell/phone within reach Nurse Communication: Mobility status PT Visit Diagnosis: Muscle weakness  (generalized) (M62.81);Difficulty in walking, not elsewhere classified (R26.2)    Time: 1125-1150 PT Time Calculation (min) (ACUTE ONLY): 25 min   Charges:   PT Evaluation $PT Eval Low Complexity: 1 Low          Mauro Kaufmann PT Acute Rehabilitation Services Pager [phone] Office [phone]   Anitta Tenny 03/01/2023, 12:49 PM

## 2023-03-02 DIAGNOSIS — N179 Acute kidney failure, unspecified: Secondary | ICD-10-CM | POA: Diagnosis not present

## 2023-03-02 LAB — GLUCOSE, CAPILLARY
Glucose-Capillary: 113 mg/dL — ABNORMAL HIGH (ref 70–99)
Glucose-Capillary: 100 mg/dL — ABNORMAL HIGH (ref 70–99)
Glucose-Capillary: 110 mg/dL — ABNORMAL HIGH (ref 70–99)
Glucose-Capillary: 104 mg/dL — ABNORMAL HIGH (ref 70–99)

## 2023-03-02 LAB — COMPREHENSIVE METABOLIC PANEL
ALT: 31 U/L (ref 0–44)
AST: 42 U/L — ABNORMAL HIGH (ref 15–41)
Albumin: 2.3 g/dL — ABNORMAL LOW (ref 3.5–5.0)
Alkaline Phosphatase: 59 U/L (ref 38–126)
Anion gap: 11 (ref 5–15)
BUN: 35 mg/dL — ABNORMAL HIGH (ref 8–23)
CO2: 23 mmol/L (ref 22–32)
Calcium: 7.8 mg/dL — ABNORMAL LOW (ref 8.9–10.3)
Chloride: 103 mmol/L (ref 98–111)
Creatinine, Ser: 2.97 mg/dL — ABNORMAL HIGH (ref 0.44–1.00)
GFR, Estimated: 16 mL/min — ABNORMAL LOW (ref >60)
Glucose, Bld: 110 mg/dL — ABNORMAL HIGH (ref 70–99)
Potassium: 3.9 mmol/L (ref 3.5–5.1)
Sodium: 137 mmol/L (ref 135–145)
Total Bilirubin: 1.3 mg/dL — ABNORMAL HIGH (ref 0.3–1.2)
Total Protein: 6.4 g/dL — ABNORMAL LOW (ref 6.5–8.1)

## 2023-03-02 LAB — CBC
HCT: 23.9 % — ABNORMAL LOW (ref 36.0–46.0)
Hemoglobin: 7.5 g/dL — ABNORMAL LOW (ref 12.0–15.0)
MCH: 26.1 pg (ref 26.0–34.0)
MCHC: 31.4 g/dL (ref 30.0–36.0)
MCV: 83.3 fL (ref 80.0–100.0)
Platelets: 277 10*3/uL (ref 150–400)
RBC: 2.87 MIL/uL — ABNORMAL LOW (ref 3.87–5.11)
RDW: 16 % — ABNORMAL HIGH (ref 11.5–15.5)
WBC: 9.1 10*3/uL (ref 4.0–10.5)
nRBC: 0 % (ref 0.0–0.2)

## 2023-03-02 LAB — C DIFFICILE QUICK SCREEN W PCR REFLEX
C Diff antigen: NEGATIVE
C Diff interpretation: NOT DETECTED
C Diff toxin: NEGATIVE

## 2023-03-02 LAB — MAGNESIUM: Magnesium: 1.7 mg/dL (ref 1.7–2.4)

## 2023-03-02 LAB — OSMOLALITY, URINE: Osmolality, Ur: 293 mOsm/kg — ABNORMAL LOW (ref 300–900)

## 2023-03-02 MED ORDER — LACTATED RINGERS IV SOLN: INTRAVENOUS | Status: DC

## 2023-03-02 MED ORDER — BACITRACIN 500 UNIT/GM EX OINT
TOPICAL_OINTMENT | Freq: Two times a day (BID) | CUTANEOUS | Status: DC
  Administered 2023-03-03: 1 via TOPICAL
  Filled 2023-03-02: qty 14
  Filled 2023-03-02 (×4): qty 0.9

## 2023-03-02 NOTE — Plan of Care (Signed)
Pt alert and oriented x 4. No bm this shift. Still need stool sample. Pt multiple wounds to arm and legs protective ointment applied to all areas. Vitals stable.  Problem: Education: Goal: Knowledge of General Education information will improve Description: Including pain rating scale, medication(s)/side effects and non-pharmacologic comfort measures Outcome: Progressing   Problem: Health Behavior/Discharge Planning: Goal: Ability to manage health-related needs will improve Outcome: Progressing   Problem: Clinical Measurements: Goal: Ability to maintain clinical measurements within normal limits will improve Outcome: Progressing Goal: Will remain free from infection Outcome: Progressing Goal: Diagnostic test results will improve Outcome: Progressing Goal: Respiratory complications will improve Outcome: Progressing Goal: Cardiovascular complication will be avoided Outcome: Progressing   Problem: Activity: Goal: Risk for activity intolerance will decrease Outcome: Progressing   Problem: Nutrition: Goal: Adequate nutrition will be maintained Outcome: Progressing   Problem: Coping: Goal: Level of anxiety will decrease Outcome: Progressing   Problem: Elimination: Goal: Will not experience complications related to bowel motility Outcome: Progressing Goal: Will not experience complications related to urinary retention Outcome: Progressing   Problem: Pain Managment: Goal: General experience of comfort will improve Outcome: Progressing   Problem: Safety: Goal: Ability to remain free from injury will improve Outcome: Progressing   Problem: Skin Integrity: Goal: Risk for impaired skin integrity will decrease Outcome: Progressing   Problem: Education: Goal: Ability to describe self-care measures that may prevent or decrease complications (Diabetes Survival Skills Education) will improve Outcome: Progressing Goal: Individualized Educational Video(s) Outcome: Progressing    Problem: Coping: Goal: Ability to adjust to condition or change in health will improve Outcome: Progressing   Problem: Fluid Volume: Goal: Ability to maintain a balanced intake and output will improve Outcome: Progressing   Problem: Health Behavior/Discharge Planning: Goal: Ability to identify and utilize available resources and services will improve Outcome: Progressing Goal: Ability to manage health-related needs will improve Outcome: Progressing   Problem: Metabolic: Goal: Ability to maintain appropriate glucose levels will improve Outcome: Progressing   Problem: Nutritional: Goal: Maintenance of adequate nutrition will improve Outcome: Progressing Goal: Progress toward achieving an optimal weight will improve Outcome: Progressing   Problem: Skin Integrity: Goal: Risk for impaired skin integrity will decrease Outcome: Progressing   Problem: Tissue Perfusion: Goal: Adequacy of tissue perfusion will improve Outcome: Progressing

## 2023-03-02 NOTE — Consult Note (Signed)
WOC Nurse Consult Note: Reason for Consult:consulted for numerous insect bite in various stages of healing. Patient reports to EDP that she has had an infestation at her home for approximately 8 months, but that they recently hired an Orthoptist. Wound type:infectious Pressure Injury POA: N/A Wound bed:red, most with dried serum, scabbing Drainage (amount, consistency, odor) serous Periwound:erythematous, intact Dressing procedure/placement/frequency:I have provided Nursing with guidance for the care of these lesions using a soap and water cleanse (bath) followed by rinse. Topical care is to be with bacitracin ointment and in areas where several can be covered with the use of an ABD pad (e.g., the upper extremities), this is to be used and secured with Kerlix roll gauze/paper tape. For more difficult to wrap areas such as the trunk, recommend that the bacitracin be applied and topped with gauze and the gauze secured with silicone foam dressings.  No dressings need to be applied to previously scabbed areas.  A silicone foam is to be applied and heels are to be floated for PI prevention.  WOC nursing team will not follow, but will remain available to this patient, the nursing and medical teams.    Thank you for inviting [place] to participate in this patient's Plan of Care.  Ladona Mow, MSN, RN, CNS, GNP, Leda Min, [employer], [employer] phone:  [phone]

## 2023-03-02 NOTE — Progress Notes (Signed)
PROGRESS NOTE  Carolyn Jimenez ZOX:[id number] DOB: 04/03/1946 DOA: 02/28/2023 PCP: Cleopatra Cedar, NP   LOS: 2 days   Brief Narrative / Interim history: 77 year old female with history of DM2, HTN, iron deficiency anemia who comes into the hospital with generalized weakness.  She tells me this has been going on for the past 3 to 4 days, associated with poor p.o. intake as well as diarrhea over the same amount of time.  She denies any fever or chills, no blood in her stool.  She denies any nausea, vomiting.  There is no melena.  In the ER she was found to have acute kidney injury with a creatinine of 3.2 compared to 0.10 September 2022,  Subjective / 24h Interval events: States that she is feeling a little bit stronger.  Feels like her appetite is coming back up.  Only had 1 bowel movement since yesterday  Assesement and Plan: Principal Problem:   Acute kidney injury (HCC) Active Problems:   Hypokalemia   Dehydration   Generalized weakness   Acute hyponatremia   Acute on chronic anemia   Essential hypertension   DM2 (diabetes mellitus, type 2) (HCC)   Principal problem Acute kidney injury -creatinine started to improve, continue IV fluids.  Her acute kidney injury is likely due to poor p.o. intake and recent diarrhea. Renal ultrasound with medical renal disease, no hydronephrosis or any other acute findings -Continue to hold ACE inhibitors -PT recommends home health PT -Continue IV fluids  Active problems Essential hypertension -blood pressure normal, given acute kidney injury hold home agents.  She is on amlodipine, benazepril, if blood pressure continues to remain normal by not needing agents upon discharge  Diarrhea-only 1 formed bowel movement since yesterday, this seems to have resolved  Iron deficiency anemia, acute on chronic -hemoglobin likely worsened due to acute kidney injury/renal disease.  Overall stable, no bleeding noted  Hyponatremia-sodium normalized with  fluids  Hypokalemia-replace potassium, magnesium acceptable at 1.7  Non-anion gap metabolic acidosis-resolved with sodium bicarb infusion, changed to Ringer's lactate today  Type 2 diabetes mellitus-continue sliding scale, hold home metformin  CBG (last 3)  Recent Labs    03/01/23 1710 03/01/23 2050 03/02/23 0758  GLUCAP 122* 118* 113*    Depression-continue Zoloft  History of seizure disorder-continue Keppra  Multiple skin lesions -multiple bug bites throughout her arms, torso, and various stages of healing.  She tells me that she has bedbugs at home for the past 8 months and has had dozens of bites.  Continue local wound care.  She also tells me that she finally hired an Orthoptist and they now got rid of the bedbugs  Scheduled Meds:  aspirin  81 mg Oral Daily   busPIRone  15 mg Oral BID   insulin aspart  0-6 Units Subcutaneous TID WC   levETIRAcetam  500 mg Oral BID   metoprolol succinate  25 mg Oral Daily   OLANZapine  10 mg Oral QHS   rosuvastatin  20 mg Oral Daily   sertraline  50 mg Oral Daily   And   sertraline  25 mg Oral QHS   Continuous Infusions:  sodium bicarbonate 150 mEq in dextrose 5 % 1,150 mL infusion 100 mL/hr at 03/02/23 0353   PRN Meds:.acetaminophen **OR** acetaminophen, melatonin, nicotine, ondansetron (ZOFRAN) IV  Current Outpatient Medications  Medication Instructions   acetaminophen (TYLENOL) 500 mg, Oral, As needed   amLODipine (NORVASC) 10 mg, Oral, Daily   aspirin 81 MG chewable tablet Oral, Daily  benazepril (LOTENSIN) 40 mg, Oral, Daily   busPIRone (BUSPAR) 15 mg, Oral, 2 times daily   diclofenac Sodium (VOLTAREN) 2 g, Topical, 4 times daily   gabapentin (NEURONTIN) 300 mg, Oral, 3 times daily   latanoprost (XALATAN) 0.005 % ophthalmic solution 1 drop, Both Eyes, Daily at bedtime   levETIRAcetam (KEPPRA) 500 mg, Oral, 2 times daily   metFORMIN (GLUCOPHAGE) 500 mg, Oral, 2 times daily with meals   metoprolol succinate (TOPROL-XL) 25  mg, Oral, Daily   OLANZapine (ZYPREXA) 10 mg, Oral, Daily at bedtime   Ozempic (0.25 or 0.5 MG/DOSE) 0.25 mg, Subcutaneous, Weekly   rosuvastatin (CRESTOR) 20 mg, Oral, Daily   sertraline (ZOLOFT) 50 mg, Oral, 2 times daily, 1 tablet in the morning and 1/2 tablet at night     Diet Orders (From admission, onward)     Start     Ordered   02/28/23 1919  Diet regular Room service appropriate? Yes; Fluid consistency: Thin  Diet effective now       Question Answer Comment  Room service appropriate? Yes   Fluid consistency: Thin      02/28/23 1918            DVT prophylaxis: SCDs Start: 02/28/23 1918   Lab Results  Component Value Date   PLT 277 03/02/2023      Code Status: Full Code  Family Communication: no family at bedside   Status is: Inpatient Remains inpatient appropriate because: AKI, possible discharge within 24-48 hours pending continued improvement in her creatinine and improvement in her p.o. intake   Level of care: Telemetry  Consultants:    Objective: Vitals:   03/01/23 1315 03/01/23 1954 03/02/23 0500 03/02/23 0528  BP: (!) 116/55 (!) 127/58  (!) 128/54  Pulse: 86 83  79  Resp: 16 18  18   Temp: 99 F (37.2 C) 98.6 F (37 C)  97.9 F (36.6 C)  TempSrc: Oral Oral  Oral  SpO2: 99% 99%  97%  Weight:   79.5 kg   Height:        Intake/Output Summary (Last 24 hours) at 03/02/2023 1010 Last data filed at 03/01/2023 1500 Gross per 24 hour  Intake 89.44 ml  Output --  Net 89.44 ml    Wt Readings from Last 3 Encounters:  03/02/23 79.5 kg  03/26/19 84.7 kg  08/07/18 71.2 kg    Examination:  Constitutional: NAD Eyes: lids and conjunctivae normal, no scleral icterus ENMT: mmm Neck: normal, supple Respiratory: clear to auscultation bilaterally, no wheezing, no crackles. Normal respiratory effort.  Cardiovascular: Regular rate and rhythm, no murmurs / rubs / gallops. No LE edema. Abdomen: soft, no distention, no tenderness. Bowel sounds  positive.  Skin: no rashes  Data Reviewed: I have independently reviewed following labs and imaging studies   CBC Recent Labs  Lab 02/28/23 1332 03/01/23 0732 03/02/23 0701  WBC 11.1* 9.7 9.1  HGB 8.4* 7.6* 7.5*  HCT 26.1* 24.6* 23.9*  PLT 301 239 277  MCV 82.6 85.7 83.3  MCH 26.6 26.5 26.1  MCHC 32.2 30.9 31.4  RDW 15.9* 16.1* 16.0*  LYMPHSABS 1.0 1.0  --   MONOABS 0.6 0.5  --   EOSABS 0.1 0.1  --   BASOSABS 0.0 0.0  --      Recent Labs  Lab 02/28/23 1332 02/28/23 1406 03/01/23 0732 03/02/23 0701  NA 133*  --  136 137  K 2.9*  --  3.4* 3.9  CL 102  --  110 103  CO2 17*  --  15* 23  GLUCOSE 104*  --  78 110*  BUN 42*  --  42* 35*  CREATININE 3.22*  --  3.48* 2.97*  CALCIUM 8.4*  --  8.2* 7.8*  AST 57*  --  46* 42*  ALT 57*  --  44 31  ALKPHOS 69  --  65 59  BILITOT 0.8  --  0.8 1.3*  ALBUMIN 2.8*  --  2.4* 2.3*  MG  --  1.8 1.8 1.7  PROCALCITON  --   --  0.81  --   INR  --   --  1.1  --   TSH  --   --  1.045  --      ------------------------------------------------------------------------------------------------------------------ No results for input(s): "CHOL", "HDL", "LDLCALC", "TRIG", "CHOLHDL", "LDLDIRECT" in the last 72 hours.  No results found for: "HGBA1C" ------------------------------------------------------------------------------------------------------------------ Recent Labs    03/01/23 0732  TSH 1.045     Cardiac Enzymes No results for input(s): "CKMB", "TROPONINI", "MYOGLOBIN" in the last 168 hours.  Invalid input(s): "CK" ------------------------------------------------------------------------------------------------------------------ No results found for: "BNP"  CBG: Recent Labs  Lab 03/01/23 0906 03/01/23 1315 03/01/23 1710 03/01/23 2050 03/02/23 0758  GLUCAP 92 124* 122* 118* 113*     No results found for this or any previous visit (from the past 240 hour(s)).   Radiology Studies: No results found.   Pamella Pert, MD, PhD [facility]  Between 7 am - 7 pm I am available, please contact me via Amion (for emergencies) or Securechat (non urgent messages)  Between 7 pm - 7 am I am not available, please contact night coverage MD/APP via Amion

## 2023-03-02 NOTE — Evaluation (Signed)
Occupational Therapy Evaluation Patient Details Name: Carolyn Jimenez MRN: [id number] DOB: 05/27/1946 Today's Date: 03/02/2023   History of Present Illness  (Carolyn Jimenez is a 77 yr old female admitted to the hospital with generalized weakness. She was found to have AKI, hypokalemia, and hyponatremia. PMH: DM, breast CA, HTN, seizures, back surgery)   Clinical Impression   The pt is currently presenting below her baseline level of functioning for self-care management, given below listed deficits (see OT problem list at end of note). She currently requires min guard assist for tasks, including sit to stand using a RW, lower body dressing, and toileting. She was noted to presented with quick fatigue with activity, which she reported to be typical of her baseline. She will benefit from further OT services to maximize her independence with self-care tasks & to facilitate her safe return home.       Recommendations for follow up therapy are one component of a multi-disciplinary discharge planning process, led by the attending physician.  Recommendations may be updated based on patient status, additional functional criteria and insurance authorization.   Assistance Recommended at Discharge Intermittent Supervision/Assistance  Patient can return home with the following Assistance with cooking/housework;Assist for transportation;A little help with bathing/dressing/bathroom    Functional Status Assessment  Patient has had a recent decline in their functional status and demonstrates the ability to make significant improvements in function in a reasonable and predictable amount of time.  Equipment Recommendations  Tub/shower bench       Precautions / Restrictions Precautions Precaution Comments: enteric precautions Restrictions Weight Bearing Restrictions: No      Mobility Bed Mobility Overal bed mobility: Modified Independent Bed Mobility: Supine to Sit     Supine to sit: Modified  independent (Device/Increase time)          Transfers Overall transfer level: Needs assistance Equipment used: Rolling walker (2 wheels) Transfers: Sit to/from Stand Sit to Stand: From elevated surface, Min guard                  Balance Overall balance assessment: Needs assistance   Sitting balance-Leahy Scale: Good       Standing balance-Leahy Scale: Fair             ADL either performed or assessed with clinical judgement   ADL Overall ADL's : Needs assistance/impaired Eating/Feeding: Independent;Sitting   Grooming: Set up;Min guard;Sitting;Standing Grooming Details (indicate cue type and reason): set-up seated or min guard standing         Upper Body Dressing : Set up;Sitting   Lower Body Dressing: Min guard;Sit to/from stand   Toilet Transfer: Min guard;Rolling walker (2 wheels);BSC/3in1                   Vision   Additional Comments: She correctly read the time depicted on the clock.            Pertinent Vitals/Pain Pain Assessment Pain Assessment: No/denies pain     Hand Dominance Right   Extremity/Trunk Assessment Upper Extremity Assessment Upper Extremity Assessment: Generalized weakness (BUE AROM WFL)   Lower Extremity Assessment Lower Extremity Assessment: Generalized weakness (BLE AROM WFL)       Communication Communication Communication: No difficulties   Cognition Arousal/Alertness: Awake/alert Behavior During Therapy: WFL for tasks assessed/performed, Flat affect Overall Cognitive Status: Within Functional Limits for tasks assessed              General Comments: Oriented x4, able to follow 1-2 steps commands without difficulty  Home Living Family/patient expects to be discharged to:: Private residence Living Arrangements: Spouse/significant other Available Help at Discharge: Family Type of Home: House Home Access: Level entry     Home Layout: One level               Home  Equipment: Cane - single point          Prior Functioning/Environment Prior Level of Function : Independent/Modified Independent             Mobility Comments: using cane at all times for mobility ADLs Comments: She was modified independent to independent with ADLs, cooking, and cleaning. She reported difficulty transferring onto & off her low toilet. She drives.  She also reported quick fatigue with activity.         OT Problem List: Decreased strength;Decreased activity tolerance;Impaired balance (sitting and/or standing)      OT Treatment/Interventions: Self-care/ADL training;Therapeutic exercise;Energy conservation;DME and/or AE instruction;Therapeutic activities;Balance training;Patient/family education    OT Goals(Current goals can be found in the care plan section) Acute Rehab OT Goals Patient Stated Goal: to take a shower today OT Goal Formulation: With patient Time For Goal Achievement: 03/16/23 Potential to Achieve Goals: Good ADL Goals Pt Will Perform Grooming: with modified independence;standing Pt Will Perform Lower Body Dressing: with modified independence;sit to/from stand Pt Will Transfer to Toilet: with modified independence;ambulating Pt Will Perform Toileting - Clothing Manipulation and hygiene: with modified independence;sit to/from stand  OT Frequency: Min 1X/week       AM-PAC OT "6 Clicks" Daily Activity     Outcome Measure Help from another person eating meals?: None Help from another person taking care of personal grooming?: A Little Help from another person toileting, which includes using toliet, bedpan, or urinal?: A Little Help from another person bathing (including washing, rinsing, drying)?: A Little Help from another person to put on and taking off regular upper body clothing?: A Little Help from another person to put on and taking off regular lower body clothing?: A Little 6 Click Score: 19   End of Session Equipment Utilized During  Treatment: Rolling walker (2 wheels) Nurse Communication: Other (comment) (Pt requesting to take a shower and need for bed linen change.)  Activity Tolerance: Patient limited by fatigue Patient left: in chair;with call bell/phone within reach  OT Visit Diagnosis: Muscle weakness (generalized) (M62.81);Unsteadiness on feet (R26.81)                Time: 1610-9604 OT Time Calculation (min): 19 min Charges:  OT General Charges $OT Visit: 1 Visit OT Evaluation $OT Eval Moderate Complexity: 1 Mod    Destiney Sanabia L Ife Vitelli, OTR/L 03/02/2023, 4:03 PM

## 2023-03-03 DIAGNOSIS — N179 Acute kidney failure, unspecified: Secondary | ICD-10-CM | POA: Diagnosis not present

## 2023-03-03 LAB — CBC
HCT: 21.7 % — ABNORMAL LOW (ref 36.0–46.0)
Hemoglobin: 6.7 g/dL — CL (ref 12.0–15.0)
MCH: 26.1 pg (ref 26.0–34.0)
MCHC: 30.9 g/dL (ref 30.0–36.0)
MCV: 84.4 fL (ref 80.0–100.0)
Platelets: 241 10*3/uL (ref 150–400)
RBC: 2.57 MIL/uL — ABNORMAL LOW (ref 3.87–5.11)
RDW: 15.9 % — ABNORMAL HIGH (ref 11.5–15.5)
WBC: 7.2 10*3/uL (ref 4.0–10.5)
nRBC: 0 % (ref 0.0–0.2)

## 2023-03-03 LAB — GASTROINTESTINAL PANEL BY PCR, STOOL (REPLACES STOOL CULTURE)

## 2023-03-03 LAB — HEMOGLOBIN A1C
Hgb A1c MFr Bld: 5.5 % (ref 4.8–5.6)
Mean Plasma Glucose: 111 mg/dL

## 2023-03-03 LAB — GLUCOSE, CAPILLARY
Glucose-Capillary: 93 mg/dL (ref 70–99)
Glucose-Capillary: 91 mg/dL (ref 70–99)
Glucose-Capillary: 96 mg/dL (ref 70–99)
Glucose-Capillary: 130 mg/dL — ABNORMAL HIGH (ref 70–99)

## 2023-03-03 LAB — COMPREHENSIVE METABOLIC PANEL
ALT: 26 U/L (ref 0–44)
AST: 27 U/L (ref 15–41)
Albumin: 2.1 g/dL — ABNORMAL LOW (ref 3.5–5.0)
Alkaline Phosphatase: 56 U/L (ref 38–126)
Anion gap: 11 (ref 5–15)
BUN: 29 mg/dL — ABNORMAL HIGH (ref 8–23)
CO2: 25 mmol/L (ref 22–32)
Calcium: 7.8 mg/dL — ABNORMAL LOW (ref 8.9–10.3)
Chloride: 104 mmol/L (ref 98–111)
Creatinine, Ser: 2.82 mg/dL — ABNORMAL HIGH (ref 0.44–1.00)
GFR, Estimated: 17 mL/min — ABNORMAL LOW (ref >60)
Glucose, Bld: 89 mg/dL (ref 70–99)
Potassium: 2.9 mmol/L — ABNORMAL LOW (ref 3.5–5.1)
Sodium: 140 mmol/L (ref 135–145)
Total Bilirubin: 0.5 mg/dL (ref 0.3–1.2)
Total Protein: 6.3 g/dL — ABNORMAL LOW (ref 6.5–8.1)

## 2023-03-03 LAB — TYPE AND SCREEN: Unit division: 0

## 2023-03-03 LAB — MAGNESIUM: Magnesium: 1.4 mg/dL — ABNORMAL LOW (ref 1.7–2.4)

## 2023-03-03 LAB — PREPARE RBC (CROSSMATCH)

## 2023-03-03 LAB — BPAM RBC: Unit Type and Rh: 7300

## 2023-03-03 MED ORDER — POTASSIUM CHLORIDE CRYS ER 20 MEQ PO TBCR
40.0000 meq | EXTENDED_RELEASE_TABLET | Freq: Once | ORAL | Status: AC
  Administered 2023-03-03: 40 meq via ORAL
  Filled 2023-03-03: qty 2

## 2023-03-03 MED ORDER — MAGNESIUM SULFATE 2 GM/50ML IV SOLN
2.0000 g | Freq: Once | INTRAVENOUS | Status: AC
  Administered 2023-03-03: 2 g via INTRAVENOUS
  Filled 2023-03-03: qty 50

## 2023-03-03 MED ORDER — SODIUM CHLORIDE 0.9% IV SOLUTION: Freq: Once | INTRAVENOUS | Status: AC

## 2023-03-03 NOTE — Progress Notes (Signed)
[facility] PROGRESS NOTE  Carolyn Jimenez (DOB: Nov 27, 1945) ZOX:[id number] PCP: Carolyn Cedar, NP  Brief Narrative: Carolyn Jimenez is a 77 year old female with history of DM2, HTN, iron deficiency anemia who comes into the hospital with generalized weakness. She tells me this has been going on for the past 3 to 4 days, associated with poor p.o. intake as well as diarrhea over the same amount of time. She denies any fever or chills, no blood in her stool. She denies any nausea, vomiting. There is no melena. In the ER she was found to have acute kidney injury with a creatinine of 3.2 compared to 0.10 September 2022.  Subjective: Up to chair today, feels diffusely weak and has no appetite at all. No fevers or chills. Urine output subjectively normal.  Objective: BP 132/61 (BP Location: Left Arm)   Pulse 83   Temp 97.9 F (36.6 C) (Oral)   Resp 17   Ht 5\' 4"  (1.626 m)   Wt 79.5 kg   LMP  (LMP Unknown)   SpO2 97%   BMI 30.08 kg/m   Gen: No distress, elderly Pulm: Clear, nonlabored  CV: RRR, no MRG or pitting edema GI: Soft, NT, ND, +BS Neuro: Alert and oriented. No new focal deficits. Ext: Warm, no deformities. Skin: Diffuse wounds involving trunk and extremities with eschars, some unroofed/excoriated. No purulence or surrounding erythema associated with any of them.  Assessment & Plan: Acute kidney injury: Suspect an element of CKD given cortical echogenicity on U/S. CrCl has not continued improving as quickly as expected. Her acute kidney injury is likely due to poor p.o. intake and recent diarrhea. Renal ultrasound with medical renal disease, no hydronephrosis or any other acute findings - Continue to hold ACE inhibitor - Continue IVF - Recheck Urine lytes/Pr:Cr.  -PT recommends home health PT  Essential hypertension:  - Holding amlodipine, benazepril, remains normotensive.     Diarrhea: Resolved.   Iron deficiency anemia, acute on chronic:  - Hgb down to 6.7g/dl, though  no bleeding. VSS. Will transfuse 1u RBCs and monitor   Hyponatremia-sodium normalized with fluids   Hypokalemia:  - Repeat supplementation  Hypomagnesemia: - Repeat supplementation   Non-anion gap metabolic acidosis: Resolved.   T2DM: Last HbA1c 5.5%.  - Hold home metformin, will plan to DC this in light of A1c and her renal function - Continue CBG checks, SSI prn  Depression:  - Continue zoloft   History of seizure disorder:  - Continue keppra   Multiple skin lesions -multiple bug bites throughout her arms, torso, and various stages of healing.  She tells me that she has bedbugs at home for the past 8 months and has had dozens of bites.   - Continue local wound care. Advised to cut fingernails and keep lesions covered.  - She also tells me that she finally hired an Orthoptist and they now got rid of the bedbugs  Tyrone Nine, MD [facility] [email or web address] 03/03/2023, 4:17 PM

## 2023-03-03 NOTE — Progress Notes (Signed)
Physical Therapy Treatment Patient Details Name: Carolyn Jimenez MRN: [id number] DOB: 04-09-1946 Today's Date: 03/03/2023   History of Present Illness Carolyn Jimenez is a 77 year old female with history of DM2, HTN, iron deficiency anemia who comes into the hospital with generalized weakness. She tells me this has been going on for the past 3 to 4 days, associated with poor p.o. intake as well as diarrhea over the same amount of time. She denies any fever or chills, no blood in her stool. She denies any nausea, vomiting.    PT Comments  General Comments: AxO x 3 pleasant Lady following all commands.  Hoping to go home soon. Assisted with amb around room when she became incont loose stools.  Assisted to bathroom for peri care/hygiene then back to recliner. Pt plans to return home with Spouse.     Assistance Recommended at Discharge Intermittent Supervision/Assistance  If plan is discharge home, recommend the following:  Can travel by private vehicle    A little help with walking and/or transfers;A little help with bathing/dressing/bathroom;Assistance with cooking/housework;Assist for transportation;Help with stairs or ramp for entrance      Equipment Recommendations  Rolling walker (2 wheels)    Recommendations for Other Services       Precautions / Restrictions Precautions Precautions: Fall Precaution Comments: enteric precautions Restrictions Weight Bearing Restrictions: No     Mobility  Bed Mobility               General bed mobility comments: OOB in recliner    Transfers Overall transfer level: Needs assistance Equipment used: Rolling walker (2 wheels) Transfers: Sit to/from Stand Sit to Stand: Supervision           General transfer comment: cues for use of UEs to self assist and min assist to bring wt up and fwd to balance in standing with RW.  Also assisted with a toilet transfer.    Ambulation/Gait Ambulation/Gait assistance: Supervision Gait  Distance (Feet): 16 Feet Assistive device: Rolling walker (2 wheels) Gait Pattern/deviations: Decreased step length - right, Decreased step length - left, Step-to pattern, Step-through pattern, Shuffle, Trunk flexed Gait velocity: decr     General Gait Details: limited distance to and from bathroom.  Pt incont loose stool.  Assisted with peri care/hygiene.   Stairs             Wheelchair Mobility     Tilt Bed    Modified Rankin (Stroke Patients Only)       Balance                                            Cognition Arousal/Alertness: Awake/alert Behavior During Therapy: WFL for tasks assessed/performed, Flat affect Overall Cognitive Status: Within Functional Limits for tasks assessed                                 General Comments: AxO x 3 pleasant Lady following all commands.  Hoping to go home soon.        Exercises      General Comments        Pertinent Vitals/Pain Pain Assessment Pain Assessment: No/denies pain    Home Living                          Prior Function  PT Goals (current goals can now be found in the care plan section) Progress towards PT goals: Progressing toward goals    Frequency    Min 1X/week      PT Plan Current plan remains appropriate    Co-evaluation              AM-PAC PT "6 Clicks" Mobility   Outcome Measure  Help needed turning from your back to your side while in a flat bed without using bedrails?: None Help needed moving from lying on your back to sitting on the side of a flat bed without using bedrails?: None Help needed moving to and from a bed to a chair (including a wheelchair)?: None Help needed standing up from a chair using your arms (e.g., wheelchair or bedside chair)?: None Help needed to walk in hospital room?: A Little Help needed climbing 3-5 steps with a railing? : A Little 6 Click Score: 22    End of Session Equipment Utilized  During Treatment: Gait belt Activity Tolerance: Patient limited by fatigue;Other (comment) (incont loose stools) Patient left: in chair;with call bell/phone within reach Nurse Communication: Mobility status PT Visit Diagnosis: Muscle weakness (generalized) (M62.81);Difficulty in walking, not elsewhere classified (R26.2)     Time: 1335-1400 PT Time Calculation (min) (ACUTE ONLY): 25 min  Charges:    $Gait Training: 8-22 mins $Therapeutic Activity: 8-22 mins PT General Charges $$ ACUTE PT VISIT: 1 Visit                     {Dakiyah Heinke  PTA Acute  Rehabilitation Services Office M-F          [phone]

## 2023-03-04 LAB — GLUCOSE, CAPILLARY
Glucose-Capillary: 94 mg/dL (ref 70–99)
Glucose-Capillary: 106 mg/dL — ABNORMAL HIGH (ref 70–99)
Glucose-Capillary: 86 mg/dL (ref 70–99)
Glucose-Capillary: 96 mg/dL (ref 70–99)

## 2023-03-04 LAB — CBC
HCT: 25.9 % — ABNORMAL LOW (ref 36.0–46.0)
Hemoglobin: 8.3 g/dL — ABNORMAL LOW (ref 12.0–15.0)
MCH: 26.8 pg (ref 26.0–34.0)
MCHC: 32 g/dL (ref 30.0–36.0)
MCV: 83.5 fL (ref 80.0–100.0)
Platelets: 237 10*3/uL (ref 150–400)
RBC: 3.1 MIL/uL — ABNORMAL LOW (ref 3.87–5.11)
RDW: 15.4 % (ref 11.5–15.5)
WBC: 8.8 10*3/uL (ref 4.0–10.5)
nRBC: 0 % (ref 0.0–0.2)

## 2023-03-04 LAB — BPAM RBC
Blood Product Expiration Date: 202407212359
ISSUE DATE / TIME: 202407011703

## 2023-03-04 LAB — PROTEIN / CREATININE RATIO, URINE
Creatinine, Urine: 38 mg/dL
Protein Creatinine Ratio: 0.89 mg/mg{Cre} — ABNORMAL HIGH (ref 0.00–0.15)
Total Protein, Urine: 34 mg/dL

## 2023-03-04 LAB — TYPE AND SCREEN

## 2023-03-04 LAB — BASIC METABOLIC PANEL
Anion gap: 10 (ref 5–15)
BUN: 25 mg/dL — ABNORMAL HIGH (ref 8–23)
CO2: 24 mmol/L (ref 22–32)
Calcium: 7.8 mg/dL — ABNORMAL LOW (ref 8.9–10.3)
Chloride: 103 mmol/L (ref 98–111)
Creatinine, Ser: 2.28 mg/dL — ABNORMAL HIGH (ref 0.44–1.00)
GFR, Estimated: 22 mL/min — ABNORMAL LOW (ref >60)
Glucose, Bld: 100 mg/dL — ABNORMAL HIGH (ref 70–99)
Potassium: 3.3 mmol/L — ABNORMAL LOW (ref 3.5–5.1)
Sodium: 137 mmol/L (ref 135–145)

## 2023-03-04 LAB — SODIUM, URINE, RANDOM: Sodium, Ur: 111 mmol/L

## 2023-03-04 LAB — MAGNESIUM: Magnesium: 1.8 mg/dL (ref 1.7–2.4)

## 2023-03-04 MED ORDER — MAGNESIUM SULFATE IN D5W 1-5 GM/100ML-% IV SOLN
1.0000 g | Freq: Once | INTRAVENOUS | Status: AC
  Administered 2023-03-04: 1 g via INTRAVENOUS
  Filled 2023-03-04: qty 100

## 2023-03-04 MED ORDER — AMLODIPINE BESYLATE 10 MG PO TABS
10.0000 mg | ORAL_TABLET | Freq: Every day | ORAL | Status: DC
  Administered 2023-03-04 – 2023-03-05 (×2): 10 mg via ORAL
  Filled 2023-03-04 (×2): qty 1

## 2023-03-04 MED ORDER — POTASSIUM CHLORIDE CRYS ER 20 MEQ PO TBCR
40.0000 meq | EXTENDED_RELEASE_TABLET | Freq: Once | ORAL | Status: AC
  Administered 2023-03-04: 40 meq via ORAL
  Filled 2023-03-04: qty 2

## 2023-03-04 NOTE — Progress Notes (Signed)
Pt. had an unwitnessed fall, as per patient she landed on her buttocks trying to use the [facility]. No physical injury noted, V/S stable. Pt denies hitting her head in the floor. Alert and oriented, No LOC. MD was notified. Will continue to monitor. Fall precautions reinforced. Bed alarm on.

## 2023-03-04 NOTE — TOC Progression Note (Addendum)
Transition of Care [facility]) - Progression Note    Patient Details  Name: Carolyn Jimenez MRN: [id number] Date of Birth: 01-25-1946  [facility] CM/SW Contact  Huston Foley Jacklynn Ganong, RN Phone Number: 03/04/2023, 11:21 AM  Clinical Narrative:    Case manager has been attempting to arrange Home Health for patient. Contacted [facility], [facility] and [place], neither are accepting [facility] Medicare patients leading into the holiday and neither will send anyone into a home with questionable bedbugs. CM has sent Chat to MD. Requesting Outpatient therapy if patient is able.    11:40am: Referral for outpatient therapy made to [facility].       Expected Discharge Plan and Services                                               Social Determinants of Health (SDOH) Interventions SDOH Screenings   Food Insecurity: No Food Insecurity (02/28/2023)  Housing: Low Risk  (02/28/2023)  Transportation Needs: No Transportation Needs (02/28/2023)  Utilities: Not At Risk (02/28/2023)  Tobacco Use: High Risk (02/28/2023)    Readmission Risk Interventions     No data to display

## 2023-03-04 NOTE — Progress Notes (Signed)
[facility] PROGRESS NOTE  Carolyn Jimenez (DOB: May 29, 1946) ZHY:[id number] PCP: Cleopatra Cedar, NP  Brief Narrative: Carolyn Jimenez is a 77 year old female with history of DM2, HTN, iron deficiency anemia who comes into the hospital with generalized weakness. She tells me this has been going on for the past 3 to 4 days, associated with poor p.o. intake as well as diarrhea over the same amount of time. She denies any fever or chills, no blood in her stool. She denies any nausea, vomiting. There is no melena. In the ER she was found to have acute kidney injury with a creatinine of 3.2 compared to 0.10 September 2022.  Subjective: Feeling stronger day by day, appetite somewhat returned and she is able to eat just fine. No bleeding.  Objective: BP (!) 155/76   Pulse 80   Temp 98.5 F (36.9 C) (Oral)   Resp 20   Ht 5\' 4"  (1.626 m)   Wt 83.6 kg   LMP  (LMP Unknown)   SpO2 95%   BMI 31.62 kg/m   Gen: Elderly female in no distress sitting EOB Pulm: Clear, nonlabored  CV: RRR, no MRG. Trace bipedal edema. GI: Soft, NT, ND, +BS Neuro: Alert and oriented. No new focal deficits. Ext: Warm, no deformities. Skin: Covered wounds without exudate or erythema as noted previously. No new rashes, lesions or ulcers on visualized skin   Assessment & Plan: Acute kidney injury: Suspect an element of CKD given cortical echogenicity on U/S. Her acute kidney injury is likely due to poor p.o. intake and recent diarrhea. Still significant departure from baseline, but improving.  - Will decrease IVF, still needs to prove ability to take po robustly today. - Continue to hold ACE inhibitor - Recheck Urine lytes/Pr:Cr still pending.  -PT recommends home health PT, will be difficult with bed bugs. The patient may be improving functionally to the point that outpatient PT will be an option.  Essential hypertension:  - Restart home amlodipine, continue metoprolol, continue holding ACEi     Diarrhea:  Resolved.   Iron deficiency anemia, acute on chronic:  - Hgb up appropriately after 1u RBC 7/1. No bleeding.   Hyponatremia-sodium normalized with fluids   Hypokalemia:  - Repeat supplementation today  Hypomagnesemia: - Repeat supplementation   Non-anion gap metabolic acidosis: Resolved.   T2DM: Last HbA1c 5.5%.  - Hold home metformin, will plan to DC this in light of A1c and her renal function - Continue CBG checks, SSI prn  Depression:  - Continue zoloft   History of seizure disorder:  - Continue keppra   Multiple skin lesions -multiple bug bites throughout her arms, torso, and various stages of healing.  She tells me that she has bedbugs at home for the past 8 months and has had dozens of bites.   - Continue local wound care. Advised to cut fingernails and keep lesions covered. Improving.  - She also tells me that she finally hired an Orthoptist and they now got rid of the bedbugs  Tyrone Nine, MD [facility] [email or web address] 03/04/2023, 11:26 AM

## 2023-03-04 NOTE — TOC Progression Note (Signed)
Transition of Care [facility]) - Progression Note    Patient Details  Name: Carolyn Jimenez MRN: [id number] Date of Birth: November 03, 1945  [facility]) CM/SW Contact  Huston Foley Jacklynn Ganong, RN Phone Number: 03/04/2023, 11:53 AM  Clinical Narrative:    Case manager spoke with patient concerning plan for outpatient therapy versus Home Health, explained that we can't locate agency that has availability at this time. Patient voiced understanding. Information for Outpatient Rehab at [facility] has been added to AVS.    Expected Discharge Plan: OP Rehab Barriers to Discharge: Continued Medical Work up  Expected Discharge Plan and Services   Discharge Planning Services: CM Consult                     DME Arranged: Dan Humphreys rolling DME Agency: [employer] Date DME Agency Contacted: 03/04/23 Time DME Agency Contacted: [id number] Representative spoke with at DME Agency: Shaune Leeks   [facility] Agency: NA         Social Determinants of Health (SDOH) Interventions SDOH Screenings   Food Insecurity: No Food Insecurity (02/28/2023)  Housing: Low Risk  (02/28/2023)  Transportation Needs: No Transportation Needs (02/28/2023)  Utilities: Not At Risk (02/28/2023)  Tobacco Use: High Risk (02/28/2023)    Readmission Risk Interventions     No data to display

## 2023-03-05 LAB — CBC
HCT: 26.7 % — ABNORMAL LOW (ref 36.0–46.0)
Hemoglobin: 8.2 g/dL — ABNORMAL LOW (ref 12.0–15.0)
MCH: 26.6 pg (ref 26.0–34.0)
MCHC: 30.7 g/dL (ref 30.0–36.0)
MCV: 86.7 fL (ref 80.0–100.0)
Platelets: 214 10*3/uL (ref 150–400)
RBC: 3.08 MIL/uL — ABNORMAL LOW (ref 3.87–5.11)
RDW: 15.5 % (ref 11.5–15.5)
WBC: 7.3 10*3/uL (ref 4.0–10.5)
nRBC: 0 % (ref 0.0–0.2)

## 2023-03-05 LAB — BASIC METABOLIC PANEL
Anion gap: 12 (ref 5–15)
BUN: 18 mg/dL (ref 8–23)
CO2: 22 mmol/L (ref 22–32)
Calcium: 7.9 mg/dL — ABNORMAL LOW (ref 8.9–10.3)
Chloride: 104 mmol/L (ref 98–111)
Creatinine, Ser: 1.96 mg/dL — ABNORMAL HIGH (ref 0.44–1.00)
GFR, Estimated: 26 mL/min — ABNORMAL LOW (ref >60)
Glucose, Bld: 80 mg/dL (ref 70–99)
Potassium: 3.3 mmol/L — ABNORMAL LOW (ref 3.5–5.1)
Sodium: 138 mmol/L (ref 135–145)

## 2023-03-05 LAB — GLUCOSE, CAPILLARY
Glucose-Capillary: 81 mg/dL (ref 70–99)
Glucose-Capillary: 90 mg/dL (ref 70–99)

## 2023-03-05 MED ORDER — POTASSIUM CHLORIDE CRYS ER 20 MEQ PO TBCR
40.0000 meq | EXTENDED_RELEASE_TABLET | ORAL | Status: AC
  Administered 2023-03-05 (×2): 40 meq via ORAL
  Filled 2023-03-05: qty 4
  Filled 2023-03-05: qty 2

## 2023-03-05 NOTE — Progress Notes (Signed)
Occupational Therapy Treatment Patient Details Name: Carolyn Jimenez MRN: [id number] DOB: 12/10/1945 Today's Date: 03/05/2023   History of present illness Carolyn Jimenez is a 77 year old female with history of DM2, HTN, iron deficiency anemia who comes into the hospital with generalized weakness. She tells me this has been going on for the past 3 to 4 days, associated with poor p.o. intake as well as diarrhea over the same amount of time. She denies any fever or chills, no blood in her stool. She denies any nausea, vomiting.   OT comments  The pt presented with good effort and participation. She performed most tasks with stand-by assist, including lower body dressing, short distance ambulation in her room using a cane, and grooming seated in the chair. She required 1 seated therapeutic rest break, given fatigue. She is demonstrating gradual functional progress. Continue OT plan of care.    Recommendations for follow up therapy are one component of a multi-disciplinary discharge planning process, led by the attending physician.  Recommendations may be updated based on patient status, additional functional criteria and insurance authorization.    Assistance Recommended at Discharge Intermittent Supervision/Assistance  Patient can return home with the following  Assistance with cooking/housework;Assist for transportation   Equipment Recommendations  Tub/shower bench    Recommendations for Other Services      Precautions / Restrictions Precautions Precautions: Fall Restrictions Weight Bearing Restrictions: No       Mobility Bed Mobility Overal bed mobility: Modified Independent Bed Mobility: Supine to Sit     Supine to sit: Modified independent (Device/Increase time)          Transfers Overall transfer level: Needs assistance Equipment used: Straight cane Transfers: Sit to/from Stand Sit to Stand: Supervision                     ADL either performed or assessed with  clinical judgement   ADL Overall ADL's : Needs assistance/impaired Eating/Feeding: Independent;Sitting Eating/Feeding Details (indicate cue type and reason): based on clinical judgement Grooming: Set up;Sitting Grooming Details (indicate cue type and reason): She performed face washing and teeth brushing seated in the bedside chair.         Upper Body Dressing : Set up;Sitting Upper Body Dressing Details (indicate cue type and reason): simulated Lower Body Dressing: Supervision/safety;Set up Lower Body Dressing Details (indicate cue type and reason): She doffed a brief, then donned another clean one, initially in sitting then in standing to pull them up over her hips.                      Cognition Arousal/Alertness: Awake/alert Behavior During Therapy: WFL for tasks assessed/performed, Flat affect Overall Cognitive Status: Within Functional Limits for tasks assessed                            Pertinent Vitals/ Pain       Pain Assessment Pain Assessment: No/denies pain         Frequency  Min 1X/week        Progress Toward Goals  OT Goals(current goals can now be found in the care plan section)  Progress towards OT goals: Progressing toward goals  Acute Rehab OT Goals OT Goal Formulation: With patient Time For Goal Achievement: 03/16/23 Potential to Achieve Goals: Good  Plan Discharge plan remains appropriate       AM-PAC OT "6 Clicks" Daily Activity     Outcome Measure  Help from another person eating meals?: None Help from another person taking care of personal grooming?: A Little Help from another person toileting, which includes using toliet, bedpan, or urinal?: A Little Help from another person bathing (including washing, rinsing, drying)?: A Little Help from another person to put on and taking off regular upper body clothing?: None Help from another person to put on and taking off regular lower body clothing?: A Little 6 Click Score:  20    End of Session    OT Visit Diagnosis: Muscle weakness (generalized) (M62.81);Unsteadiness on feet (R26.81)   Activity Tolerance Patient limited by fatigue;Other (comment) (Fair+ overall tolerance)   Patient Left in chair;with call bell/phone within reach;with chair alarm set   Nurse Communication Mobility status        Time: [phone] OT Time Calculation (min): 28 min  Charges: OT General Charges $OT Visit: 1 Visit OT Treatments $Self Care/Home Management : 23-37 mins     Reuben Likes, OTR/L 03/05/2023, 10:25 AM

## 2023-03-05 NOTE — Discharge Summary (Signed)
Physician Discharge Summary  Carolyn Jimenez ZOX:[id number] DOB: Aug 31, 1946 DOA: 02/28/2023  PCP: Cleopatra Cedar, NP  Admit date: 02/28/2023 Discharge date: 03/05/2023  Admitted From: Home Disposition: Home  Recommendations for Outpatient Follow-up:  Follow up with PCP in 1-2 weeks Discontinued home benazepril secondary to acute renal failure Metformin discontinued given extremely well-controlled diabetes in the setting of AKI Please obtain BMP in one week to reassess renal function  Home Health: Outpatient PT Equipment/Devices: None  Discharge Condition: Stable CODE STATUS: Full code Diet recommendation: Heart healthy/consistent carbohydrate diet  History of present illness:  Carolyn Jimenez is a 77 year old female with history of DM2, HTN, iron deficiency anemia who comes into the hospital with generalized weakness. She tells me this has been going on for the past 3 to 4 days, associated with poor p.o. intake as well as diarrhea over the same amount of time. She denies any fever or chills, no blood in her stool. She denies any nausea, vomiting. There is no melena. In the ER she was found to have acute kidney injury with a creatinine of 3.2 compared to 0.9 in January 2024.  TRH was consulted for admission for further evaluation management of acute renal failure.  Hospital course:  Acute kidney injury:  Suspect an element of CKD given cortical echogenicity on U/S. Her acute kidney injury is likely due to poor p.o. intake and recent diarrhea. Still significant departure from baseline of 0.9 in January 2024.  Patient's home benazepril was discontinued.  Patient was supported with IV fluid hydration with improvement of creatinine from a peak of 3.48 during hospitalization to 1.96 at time of discharge.  Will discontinue benazepril.  Outpatient follow-up with PCP with recommended repeat BMP 1 week.   Essential hypertension:  Continue amlodipine 10 mg p.o. daily, metoprolol succinate 25 mg p.o.  daily.  Benazepril discontinued due to AKI as above.     Diarrhea: Resolved.   Iron deficiency anemia, acute on chronic:   - Hgb up appropriately after 1u RBC 7/1. No bleeding.   Hyponatremia Sodium normalized following IV fluid hydration, suspect hypovolemic hyponatremia.   Hypokalemia:  Etiology likely secondary to GI loss from diarrhea initially during hospitalization which has now resolved.  Repleted during hospitalization.   Hypomagnesemia: Repleted during pulsation.   Non-anion gap metabolic acidosis: Resolved.   T2DM:  Last HbA1c 5.5% on 03/01/2023, well-controlled.  Will discontinue metformin and advised to diet control for now.  Outpatient follow-up with PCP.   Depression:  Continue zoloft   History of seizure disorder:  Continue keppra   Multiple skin lesions Multiple bug bites throughout her arms, torso, and various stages of healing.  She tells me that she has bedbugs at home for the past 8 months and has had dozens of bites.  Advised to cut fingernails and keep lesions covered. Improving.  Patient reports that she finally hired an Orthoptist and they now got rid of the bedbugs.   Discharge Diagnoses:  Principal Problem:   Acute kidney injury (HCC) Active Problems:   Hypokalemia   Dehydration   Generalized weakness   Acute hyponatremia   Acute on chronic anemia   Essential hypertension   DM2 (diabetes mellitus, type 2) [facility])    Discharge Instructions  Discharge Instructions     Call MD for:  difficulty breathing, headache or visual disturbances   Complete by: As directed    Call MD for:  extreme fatigue   Complete by: As directed    Call MD for:  persistant dizziness  or light-headedness   Complete by: As directed    Call MD for:  persistant nausea and vomiting   Complete by: As directed    Call MD for:  severe uncontrolled pain   Complete by: As directed    Call MD for:  temperature >100.4   Complete by: As directed    Diet - low sodium heart  healthy   Complete by: As directed    Increase activity slowly   Complete by: As directed    No wound care   Complete by: As directed       Allergies as of 03/05/2023       Reactions   Codeine    Latex    Sulfa Antibiotics         Medication List     STOP taking these medications    benazepril 40 MG tablet Commonly known as: LOTENSIN   metFORMIN 500 MG tablet Commonly known as: GLUCOPHAGE       TAKE these medications    acetaminophen 500 MG tablet Commonly known as: TYLENOL Take 500 mg by mouth as needed for mild pain or moderate pain.   amLODipine 10 MG tablet Commonly known as: NORVASC Take 10 mg by mouth daily.   aspirin 81 MG chewable tablet Chew by mouth daily.   busPIRone 15 MG tablet Commonly known as: BUSPAR Take 15 mg by mouth 2 (two) times daily.   diclofenac Sodium 1 % Gel Commonly known as: VOLTAREN Apply 2 g topically 4 (four) times daily.   gabapentin 300 MG capsule Commonly known as: NEURONTIN Take 300 mg by mouth 3 (three) times daily.   latanoprost 0.005 % ophthalmic solution Commonly known as: XALATAN Place 1 drop into both eyes at bedtime.   levETIRAcetam 500 MG tablet Commonly known as: KEPPRA Take 500 mg by mouth 2 (two) times daily.   metoprolol succinate 25 MG 24 hr tablet Commonly known as: TOPROL-XL Take 25 mg by mouth daily.   OLANZapine 10 MG tablet Commonly known as: ZYPREXA Take 10 mg by mouth at bedtime.   Ozempic (0.25 or 0.5 MG/DOSE) 2 MG/3ML Sopn Generic drug: Semaglutide(0.25 or 0.5MG /DOS) Inject 0.25 mg into the skin once a week.   rosuvastatin 20 MG tablet Commonly known as: CRESTOR Take 20 mg by mouth daily.   sertraline 50 MG tablet Commonly known as: ZOLOFT Take 50 mg by mouth 2 (two) times daily. 1 tablet in the morning and 1/2 tablet at night               Durable Medical Equipment  (From admission, onward)           Start     Ordered   03/01/23 1243  For home use only DME  Walker rolling  Once       Question Answer Comment  Walker: With 5 Inch Wheels   Patient needs a walker to treat with the following condition Difficulty in walking, not elsewhere classified      03/01/23 1243            Follow-up Information     [facility]. Schedule an appointment as soon as possible for a visit.   Why: you will get a call from [facility], located at [address] [place], [phone], If you have not received a call in 48 hrs, please call to schedule.        Cleopatra Cedar, NP. Schedule an appointment as  soon as possible for a visit in 1 week(s).   Specialty: Family Medicine Contact information: [address] [place]  [address] [place] [address] [phone]                Allergies  Allergen Reactions   Codeine    Latex    Sulfa Antibiotics     Consultations: None   Procedures/Studies: DG Chest Port 1 View  Result Date: 02/28/2023 CLINICAL DATA:  Generalized weakness EXAM: PORTABLE CHEST 1 VIEW COMPARISON:  09/12/2022 FINDINGS: The heart size and mediastinal contours are within normal limits. Both lungs are clear. The visualized skeletal structures are unremarkable. IMPRESSION: No active disease. Electronically Signed   By: Ernie Avena M.D.   On: 02/28/2023 20:14   US Renal  Result Date: 02/28/2023 CLINICAL DATA:  Acute kidney injury EXAM: RENAL / URINARY TRACT ULTRASOUND COMPLETE COMPARISON:  CT 03/26/2019 FINDINGS: Right Kidney: Renal measurements: 10 x 5.1 x 6.3 cm = volume: 167 mL. Slightly echogenic cortex. No mass or hydronephrosis Left Kidney: Renal measurements: 10.3 x 6.2 x 5.2 cm = volume: 174 mL. Slightly echogenic cortex. No mass or hydronephrosis. Bladder: Nearly empty Other: None. IMPRESSION: Slightly echogenic renal cortex bilaterally, may be seen with medical renal disease. No hydronephrosis. Electronically Signed   By: Jasmine Pang M.D.   On:  02/28/2023 17:54     Subjective: Patient seen examined bedside, resting calmly.  Lying in bed.  No specific complaints.  Renal function much improved.  Ready for discharge home.  No other questions or concerns at this time.  Denies headache, no dizziness, no chest pain, no palpitations, no shortness of breath, no abdominal pain, no fever/chills/night sweats, no nausea cefonicid diarrhea, no focal weakness, no fatigue, no paresthesias.  No acute events overnight per nursing staff.  Discharge Exam: Vitals:   03/04/23 1410 03/04/23 2115  BP: (!) 152/69 (!) 143/45  Pulse: 78 72  Resp: 20   Temp:    SpO2:  96%   Vitals:   03/04/23 1155 03/04/23 1410 03/04/23 2115 03/05/23 0455  BP: (!) 146/73 (!) 152/69 (!) 143/45   Pulse: 85 78 72   Resp: 18 20    Temp:      TempSrc:      SpO2: 99%  96%   Weight:    81.1 kg  Height:        Physical Exam: GEN: NAD, alert and oriented x 3, elderly/chronically ill in appearance HEENT: NCAT, PERRL, EOMI, sclera clear, MMM PULM: CTAB w/o wheezes/crackles, normal respiratory effort, on room air CV: RRR w/o M/G/R GI: abd soft, NTND, NABS, no R/G/M MSK: no peripheral edema, muscle strength globally intact 5/5 bilateral upper/lower extremities NEURO: CN II-XII intact, no focal deficits, sensation to light touch intact PSYCH: normal mood/affect Integumentary: Multiple skin wounds noted, dressings in place in various stages of healing, no concerning fluctuance/erythema; otherwise no other concerning rashes/lesions/wounds noted on exposed skin surfaces.    The results of significant diagnostics from this hospitalization (including imaging, microbiology, ancillary and laboratory) are listed below for reference.     Microbiology: Recent Results (from the past 240 hour(s))  C Difficile Quick Screen w PCR reflex     Status: None   Collection Time: 03/02/23  9:19 AM   Specimen: STOOL  Result Value Ref Range Status   C Diff antigen NEGATIVE NEGATIVE Final    C Diff toxin NEGATIVE NEGATIVE Final   C Diff interpretation No C. difficile detected.  Final  Comment: Performed at [facility], [address], [place], [place] [address]  Gastrointestinal Panel by PCR , Stool     Status: None   Collection Time: 03/02/23  9:19 AM   Specimen: STOOL  Result Value Ref Range Status   Campylobacter species NOT DETECTED NOT DETECTED Final   Plesimonas shigelloides NOT DETECTED NOT DETECTED Final   Salmonella species NOT DETECTED NOT DETECTED Final   Yersinia enterocolitica NOT DETECTED NOT DETECTED Final   Vibrio species NOT DETECTED NOT DETECTED Final   Vibrio cholerae NOT DETECTED NOT DETECTED Final   Enteroaggregative E coli (EAEC) NOT DETECTED NOT DETECTED Final   Enteropathogenic E coli (EPEC) NOT DETECTED NOT DETECTED Final   Enterotoxigenic E coli (ETEC) NOT DETECTED NOT DETECTED Final   Shiga like toxin producing E coli (STEC) NOT DETECTED NOT DETECTED Final   Shigella/Enteroinvasive E coli (EIEC) NOT DETECTED NOT DETECTED Final   Cryptosporidium NOT DETECTED NOT DETECTED Final   Cyclospora cayetanensis NOT DETECTED NOT DETECTED Final   Entamoeba histolytica NOT DETECTED NOT DETECTED Final   Giardia lamblia NOT DETECTED NOT DETECTED Final   Adenovirus F40/41 NOT DETECTED NOT DETECTED Final   Astrovirus NOT DETECTED NOT DETECTED Final   Norovirus GI/GII NOT DETECTED NOT DETECTED Final   Rotavirus A NOT DETECTED NOT DETECTED Final   Sapovirus (I, II, IV, and V) NOT DETECTED NOT DETECTED Final    Comment: Performed at [facility], [address] [place], [place] [address]     Labs: BNP (last 3 results) No results for input(s): "BNP" in the last 8760 hours. Basic Metabolic Panel: Recent Labs  Lab 02/28/23 1406 03/01/23 0732 03/02/23 0701 03/03/23 0442 03/04/23 0552 03/05/23 0557  NA  --  136 137 140 137 138  K  --  3.4* 3.9 2.9* 3.3* 3.3*  CL  --  110 103 104 103 104  CO2  --  15* 23 25 24 22   GLUCOSE   --  78 110* 89 100* 80  BUN  --  42* 35* 29* 25* 18  CREATININE  --  3.48* 2.97* 2.82* 2.28* 1.96*  CALCIUM  --  8.2* 7.8* 7.8* 7.8* 7.9*  MG 1.8 1.8 1.7 1.4* 1.8  --    Liver Function Tests: Recent Labs  Lab 02/28/23 1332 03/01/23 0732 03/02/23 0701 03/03/23 0442  AST 57* 46* 42* 27  ALT 57* 44 31 26  ALKPHOS 69 65 59 56  BILITOT 0.8 0.8 1.3* 0.5  PROT 7.9 7.2 6.4* 6.3*  ALBUMIN 2.8* 2.4* 2.3* 2.1*   No results for input(s): "LIPASE", "AMYLASE" in the last 168 hours. No results for input(s): "AMMONIA" in the last 168 hours. CBC: Recent Labs  Lab 02/28/23 1332 03/01/23 0732 03/02/23 0701 03/03/23 0442 03/04/23 0552 03/05/23 0557  WBC 11.1* 9.7 9.1 7.2 8.8 7.3  NEUTROABS 9.3* 7.9*  --   --   --   --   HGB 8.4* 7.6* 7.5* 6.7* 8.3* 8.2*  HCT 26.1* 24.6* 23.9* 21.7* 25.9* 26.7*  MCV 82.6 85.7 83.3 84.4 83.5 86.7  PLT 301 239 277 241 237 214   Cardiac Enzymes: Recent Labs  Lab 02/28/23 1406  CKTOTAL 180   BNP: Invalid input(s): "POCBNP" CBG: Recent Labs  Lab 03/04/23 1151 03/04/23 1709 03/04/23 2111 03/05/23 0810 03/05/23 1127  GLUCAP 106* 86 96 81 90   D-Dimer No results for input(s): "DDIMER" in the last 72 hours. Hgb A1c No results for input(s): "HGBA1C" in the last 72 hours.  Lipid Profile No results for input(s): "CHOL", "HDL", "LDLCALC", "TRIG", "CHOLHDL", "LDLDIRECT" in the last 72 hours. Thyroid function studies No results for input(s): "TSH", "T4TOTAL", "T3FREE", "THYROIDAB" in the last 72 hours.  Invalid input(s): "FREET3" Anemia work up No results for input(s): "VITAMINB12", "FOLATE", "FERRITIN", "TIBC", "IRON", "RETICCTPCT" in the last 72 hours. Urinalysis    Component Value Date/Time   COLORURINE AMBER (A) 02/28/2023 1705   APPEARANCEUR CLOUDY (A) 02/28/2023 1705   LABSPEC 1.025 02/28/2023 1705   PHURINE 5.5 02/28/2023 1705   GLUCOSEU NEGATIVE 02/28/2023 1705   HGBUR MODERATE (A) 02/28/2023 1705   BILIRUBINUR SMALL (A) 02/28/2023  1705   KETONESUR NEGATIVE 02/28/2023 1705   PROTEINUR >=300 (A) 02/28/2023 1705   NITRITE NEGATIVE 02/28/2023 1705   LEUKOCYTESUR NEGATIVE 02/28/2023 1705   Sepsis Labs Recent Labs  Lab 03/02/23 0701 03/03/23 0442 03/04/23 0552 03/05/23 0557  WBC 9.1 7.2 8.8 7.3   Microbiology Recent Results (from the past 240 hour(s))  C Difficile Quick Screen w PCR reflex     Status: None   Collection Time: 03/02/23  9:19 AM   Specimen: STOOL  Result Value Ref Range Status   C Diff antigen NEGATIVE NEGATIVE Final   C Diff toxin NEGATIVE NEGATIVE Final   C Diff interpretation No C. difficile detected.  Final    Comment: Performed at [facility], [address], [place], [place] [address]  Gastrointestinal Panel by PCR , Stool     Status: None   Collection Time: 03/02/23  9:19 AM   Specimen: STOOL  Result Value Ref Range Status   Campylobacter species NOT DETECTED NOT DETECTED Final   Plesimonas shigelloides NOT DETECTED NOT DETECTED Final   Salmonella species NOT DETECTED NOT DETECTED Final   Yersinia enterocolitica NOT DETECTED NOT DETECTED Final   Vibrio species NOT DETECTED NOT DETECTED Final   Vibrio cholerae NOT DETECTED NOT DETECTED Final   Enteroaggregative E coli (EAEC) NOT DETECTED NOT DETECTED Final   Enteropathogenic E coli (EPEC) NOT DETECTED NOT DETECTED Final   Enterotoxigenic E coli (ETEC) NOT DETECTED NOT DETECTED Final   Shiga like toxin producing E coli (STEC) NOT DETECTED NOT DETECTED Final   Shigella/Enteroinvasive E coli (EIEC) NOT DETECTED NOT DETECTED Final   Cryptosporidium NOT DETECTED NOT DETECTED Final   Cyclospora cayetanensis NOT DETECTED NOT DETECTED Final   Entamoeba histolytica NOT DETECTED NOT DETECTED Final   Giardia lamblia NOT DETECTED NOT DETECTED Final   Adenovirus F40/41 NOT DETECTED NOT DETECTED Final   Astrovirus NOT DETECTED NOT DETECTED Final   Norovirus GI/GII NOT DETECTED NOT DETECTED Final   Rotavirus A NOT DETECTED NOT  DETECTED Final   Sapovirus (I, II, IV, and V) NOT DETECTED NOT DETECTED Final    Comment: Performed at [facility], [address], [place], [place] [address]     Time coordinating discharge: Over 30 minutes  SIGNED:   Alvira Philips [place], DO  [facility] 03/05/2023, 12:56 PM

## 2024-08-06 ENCOUNTER — Emergency Department (HOSPITAL_BASED_OUTPATIENT_CLINIC_OR_DEPARTMENT_OTHER)

## 2024-08-06 ENCOUNTER — Emergency Department (HOSPITAL_BASED_OUTPATIENT_CLINIC_OR_DEPARTMENT_OTHER)
Admission: EM | Admit: 2024-08-06 | Discharge: 2024-08-06 | Disposition: A | Discharge status: Home / Self Care | Attending: Emergency Medicine | Admitting: Emergency Medicine

## 2024-08-06 ENCOUNTER — Encounter (HOSPITAL_BASED_OUTPATIENT_CLINIC_OR_DEPARTMENT_OTHER): Payer: Self-pay

## 2024-08-06 ENCOUNTER — Other Ambulatory Visit: Payer: Self-pay

## 2024-08-06 DIAGNOSIS — R002 Palpitations: Secondary | ICD-10-CM | POA: Insufficient documentation

## 2024-08-06 DIAGNOSIS — R1031 Right lower quadrant pain: Secondary | ICD-10-CM | POA: Insufficient documentation

## 2024-08-06 DIAGNOSIS — E119 Type 2 diabetes mellitus without complications: Secondary | ICD-10-CM | POA: Insufficient documentation

## 2024-08-06 DIAGNOSIS — Z853 Personal history of malignant neoplasm of breast: Secondary | ICD-10-CM | POA: Insufficient documentation

## 2024-08-06 DIAGNOSIS — Z79899 Other long term (current) drug therapy: Secondary | ICD-10-CM | POA: Insufficient documentation

## 2024-08-06 DIAGNOSIS — Z7982 Long term (current) use of aspirin: Secondary | ICD-10-CM | POA: Insufficient documentation

## 2024-08-06 DIAGNOSIS — I10 Essential (primary) hypertension: Secondary | ICD-10-CM | POA: Insufficient documentation

## 2024-08-06 DIAGNOSIS — Z91040 Latex allergy status: Secondary | ICD-10-CM | POA: Insufficient documentation

## 2024-08-06 DIAGNOSIS — R1030 Lower abdominal pain, unspecified: Secondary | ICD-10-CM

## 2024-08-06 DIAGNOSIS — I160 Hypertensive urgency: Secondary | ICD-10-CM | POA: Insufficient documentation

## 2024-08-06 DIAGNOSIS — R1032 Left lower quadrant pain: Secondary | ICD-10-CM | POA: Insufficient documentation

## 2024-08-06 LAB — BASIC METABOLIC PANEL WITH GFR
Anion gap: 11 (ref 5–15)
BUN: 15 mg/dL (ref 8–23)
CO2: 20 mmol/L — ABNORMAL LOW (ref 22–32)
Calcium: 8.7 mg/dL — ABNORMAL LOW (ref 8.9–10.3)
Chloride: 106 mmol/L (ref 98–111)
Creatinine, Ser: 0.92 mg/dL (ref 0.44–1.00)
GFR, Estimated: >60 mL/min (ref >60)
Glucose, Bld: 82 mg/dL (ref 70–99)
Potassium: 3.6 mmol/L (ref 3.5–5.1)
Sodium: 137 mmol/L (ref 135–145)

## 2024-08-06 LAB — WET PREP, GENITAL
Clue Cells Wet Prep HPF POC: NONE SEEN
Sperm: NONE SEEN
Trich, Wet Prep: NONE SEEN
WBC, Wet Prep HPF POC: <10 (ref <10)
Yeast Wet Prep HPF POC: NONE SEEN

## 2024-08-06 LAB — URINALYSIS, ROUTINE W REFLEX MICROSCOPIC
Bilirubin Urine: NEGATIVE
Glucose, UA: NEGATIVE mg/dL
Hgb urine dipstick: NEGATIVE
Ketones, ur: NEGATIVE mg/dL
Leukocytes,Ua: NEGATIVE
Nitrite: NEGATIVE
Protein, ur: NEGATIVE mg/dL
Specific Gravity, Urine: 1.005 (ref 1.005–1.030)
pH: 6 (ref 5.0–8.0)

## 2024-08-06 LAB — CBC
HCT: 34 % — ABNORMAL LOW (ref 36.0–46.0)
Hemoglobin: 10.4 g/dL — ABNORMAL LOW (ref 12.0–15.0)
MCH: 26.6 pg (ref 26.0–34.0)
MCHC: 30.6 g/dL (ref 30.0–36.0)
MCV: 87 fL (ref 80.0–100.0)
Platelets: 223 K/uL (ref 150–400)
RBC: 3.91 MIL/uL (ref 3.87–5.11)
RDW: 16.4 % — ABNORMAL HIGH (ref 11.5–15.5)
WBC: 7.8 K/uL (ref 4.0–10.5)
nRBC: 0 % (ref 0.0–0.2)

## 2024-08-06 LAB — HEPATIC FUNCTION PANEL
ALT: 9 U/L (ref 0–44)
AST: 17 U/L (ref 15–41)
Albumin: 3.9 g/dL (ref 3.5–5.0)
Alkaline Phosphatase: 98 U/L (ref 38–126)
Bilirubin, Direct: <0.1 mg/dL (ref 0.0–0.2)
Total Bilirubin: 0.3 mg/dL (ref 0.0–1.2)
Total Protein: 7.7 g/dL (ref 6.5–8.1)

## 2024-08-06 LAB — LIPASE, BLOOD: Lipase: 41 U/L (ref 11–51)

## 2024-08-06 LAB — TROPONIN T, HIGH SENSITIVITY
Troponin T High Sensitivity: <15 ng/L (ref 0–19)
Troponin T High Sensitivity: <15 ng/L (ref 0–19)

## 2024-08-06 MED ORDER — IOHEXOL 300 MG/ML  SOLN
100.0000 mL | Freq: Once | INTRAMUSCULAR | Status: AC | PRN
  Administered 2024-08-06: 100 mL via INTRAVENOUS

## 2024-08-06 NOTE — ED Triage Notes (Signed)
 Pt states BP was 199/90 at home, pt has not missed any bp medications. States she has had palpitations. C/o lower abdominal pain and bilateral flank pain. Last week pt had intermittent blood on toilet paper after using the bathroom, but this has subsided.

## 2024-08-06 NOTE — Discharge Instructions (Signed)
 You are seen today for high blood pressure, palpitations and lower abdominal pain.  Additionally noting that you had said that you experienced some vaginal spotting a week ago.  I will need for you to continue to follow-up with your PCP for reevaluation of the vaginal spotting as well as for reevaluation of your lower abdominal pain if it is persistent and for further blood pressure control with your blood pressure notably elevated today.  We would need to see you back in the ER if you begin to have any headache, vision changes, chest pain, shortness of breath, with elevated blood pressure.   Additionally if you begin to have any persistent blood in urine or stool, persistent vaginal bleeding or uncontrollable abdominal pain or persistent vomiting, we will need to see back in the emergency department.  Otherwise continue to follow-up with your PCP for further evaluation.

## 2024-08-06 NOTE — ED Notes (Signed)
 Discharge instructions reviewed with patient. Patient questions answered and opportunity for education reviewed. Patient voices understanding of discharge instructions with no further questions. Patient ambulatory with steady gait to lobby.

## 2024-08-06 NOTE — ED Provider Notes (Signed)
 [place] [facility] Provider Note   CSN: [id number] Arrival date & time: 08/06/24  1858     Patient presents with: Hypertension   Carolyn Jimenez is a 78 y.o. female.   Hypertension Associated symptoms include abdominal pain.  Patient is a 78 year old female presenting today for concerns for high blood pressure noting a BP of 189/90 at home additionally noting that she has had some lower abdominal tenderness localizing to left lower quadrant that has been ongoing x 3 days.  Notes she has been endorsing some mild nausea but has since improved.  Noting that she has noticed also additional odor from her vaginal area. Reports that she did notice spotting from her vaginal area last week that has since stopped and not recurred.  Has been taking her blood pressure medication as prescribed.  Endorses that she did have some palpitations.     Prior to Admission medications   Medication Sig Start Date End Date Taking? Authorizing Provider  acetaminophen  (TYLENOL ) 500 MG tablet Take 500 mg by mouth as needed for mild pain or moderate pain.    [provider]  amLODipine  (NORVASC ) 10 MG tablet Take 10 mg by mouth daily.    [provider]  aspirin  81 MG chewable tablet Chew by mouth daily.    [provider]  busPIRone  (BUSPAR ) 15 MG tablet Take 15 mg by mouth 2 (two) times daily.    [provider]  diclofenac Sodium (VOLTAREN) 1 % GEL Apply 2 g topically 4 (four) times daily. 02/12/19   [provider]  gabapentin (NEURONTIN) 300 MG capsule Take 300 mg by mouth 3 (three) times daily.     [provider]  latanoprost (XALATAN) 0.005 % ophthalmic solution Place 1 drop into both eyes at bedtime. 09/06/22   [provider]  levETIRAcetam  (KEPPRA ) 500 MG tablet Take 500 mg by mouth 2 (two) times daily.    [provider]  metoprolol  succinate (TOPROL -XL) 25 MG 24 hr tablet Take 25 mg by mouth  daily.    [provider]  OLANZapine  (ZYPREXA ) 10 MG tablet Take 10 mg by mouth at bedtime.    [provider]  OZEMPIC, 0.25 OR 0.5 MG/DOSE, 2 MG/3ML SOPN Inject 0.25 mg into the skin once a week.    [provider]  rosuvastatin  (CRESTOR ) 20 MG tablet Take 20 mg by mouth daily.    [provider]  sertraline  (ZOLOFT ) 50 MG tablet Take 50 mg by mouth 2 (two) times daily. 1 tablet in the morning and 1/2 tablet at night    [provider]    Allergies: Codeine, Latex, and Sulfa antibiotics    Review of Systems  Cardiovascular:  Positive for palpitations.  Gastrointestinal:  Positive for abdominal pain and nausea.  All other systems reviewed and are negative.   Updated Vital Signs BP (!) 170/71   Pulse 60   Temp 98.5 F (36.9 C)   Resp 17   Ht 5' 4 (1.626 m)   Wt 78 kg   LMP  (LMP Unknown)   SpO2 97%   BMI 29.52 kg/m   Physical Exam Vitals and nursing note reviewed.  Constitutional:      General: She is not in acute distress.    Appearance: Normal appearance. She is not ill-appearing or diaphoretic.  HENT:     Head: Normocephalic and atraumatic.  Eyes:     General: No scleral icterus.       Right  eye: No discharge.        Left eye: No discharge.     Extraocular Movements: Extraocular movements intact.     Conjunctiva/sclera: Conjunctivae normal.  Cardiovascular:     Rate and Rhythm: Normal rate and regular rhythm.     Pulses: Normal pulses.     Heart sounds: Normal heart sounds. No murmur heard.    No friction rub. No gallop.  Pulmonary:     Effort: Pulmonary effort is normal. No respiratory distress.     Breath sounds: No stridor. No wheezing, rhonchi or rales.  Chest:     Chest wall: No tenderness.  Abdominal:     General: Abdomen is flat. There is no distension.     Palpations: Abdomen is soft.     Tenderness: There is abdominal tenderness (Noted to have left lower quadrant tenderness to palpation with some mild  right lower quadrant tenderness). There is no right CVA tenderness, left CVA tenderness, guarding or rebound.  Musculoskeletal:        General: No swelling, deformity or signs of injury.     Cervical back: Normal range of motion. No rigidity.     Right lower leg: No edema.     Left lower leg: No edema.  Skin:    General: Skin is warm and dry.     Findings: No bruising, erythema or lesion.  Neurological:     General: No focal deficit present.     Mental Status: She is alert and oriented to person, place, and time. Mental status is at baseline.     Sensory: No sensory deficit.     Motor: No weakness.  Psychiatric:        Mood and Affect: Mood normal.     (all labs ordered are listed, but only abnormal results are displayed) Labs Reviewed  BASIC METABOLIC PANEL WITH GFR - Abnormal; Notable for the following components:      Result Value   CO2 20 (*)    Calcium  8.7 (*)    All other components within normal limits  CBC - Abnormal; Notable for the following components:   Hemoglobin 10.4 (*)    HCT 34.0 (*)    RDW 16.4 (*)    All other components within normal limits  URINALYSIS, ROUTINE W REFLEX MICROSCOPIC - Abnormal; Notable for the following components:   Color, Urine STRAW (*)    All other components within normal limits  WET PREP, GENITAL  LIPASE, BLOOD  HEPATIC FUNCTION PANEL  GC/CHLAMYDIA PROBE AMP ([place]) NOT AT [facility]  TROPONIN T, HIGH SENSITIVITY  TROPONIN T, HIGH SENSITIVITY    EKG: EKG Interpretation Date/Time:  Friday August 06 2024 19:18:29 EST Ventricular Rate:  67 PR Interval:  142 QRS Duration:  96 QT Interval:  420 QTC Calculation: 444 R Axis:   4  Text Interpretation: Sinus rhythm Consider right atrial enlargement Low voltage, precordial leads Borderline T wave abnormalities No significant change since last tracing Confirmed by Ellouise Fine ([id number]) on 08/06/2024 9:12:18 PM  Radiology: CT ABDOMEN PELVIS W CONTRAST Result Date:  08/06/2024 EXAM: CT ABDOMEN AND PELVIS WITH CONTRAST 08/06/2024 10:40:46 PM TECHNIQUE: CT of the abdomen and pelvis was performed with the administration of 100 mL of iohexol  (OMNIPAQUE ) 300 MG/ML solution. Multiplanar reformatted images are provided for review. Automated exposure control, iterative reconstruction, and/or weight-based adjustment of the mA/kV was utilized to reduce the radiation dose to as low as reasonably achievable. COMPARISON: 03/26/2019. CLINICAL HISTORY: Abdominal/flank pain, stone suspected; LLQ abdominal  pain; RLQ abdominal pain. FINDINGS: LOWER CHEST: No acute abnormality. LIVER: The liver is unremarkable. GALLBLADDER AND BILE DUCTS: Prior cholecystectomy. No biliary ductal dilatation. SPLEEN: No acute abnormality. PANCREAS: No acute abnormality. ADRENAL GLANDS: No acute abnormality. KIDNEYS, URETERS AND BLADDER: No stones in the kidneys or ureters. No hydronephrosis. No perinephric or periureteral stranding. Urinary bladder is unremarkable. GI AND BOWEL: Stomach demonstrates no acute abnormality. There is no bowel obstruction. PERITONEUM AND RETROPERITONEUM: No ascites. No free air. VASCULATURE: Aorta is normal in caliber. Aortic atherosclerosis. LYMPH NODES: No lymphadenopathy. REPRODUCTIVE ORGANS: Prior hysterectomy. BONES AND SOFT TISSUES: Postoperative changes in the lower lumbar spine. No acute osseous abnormality. No focal soft tissue abnormality. IMPRESSION: 1. No acute findings in the abdomen or pelvis. Electronically signed by: Franky Crease MD 08/06/2024 10:46 PM EST RP Workstation: [id number]   DG Chest 2 View Result Date: 08/06/2024 CLINICAL DATA:  Palpitations, hypertension EXAM: CHEST - 2 VIEW COMPARISON:  02/28/2023 FINDINGS: The heart size and mediastinal contours are within normal limits. Both lungs are clear. The visualized skeletal structures are unremarkable. IMPRESSION: No active cardiopulmonary disease. Electronically Signed   By: Ozell Daring M.D.   On: 08/06/2024  20:00   Procedures   Medications Ordered in the ED  iohexol  (OMNIPAQUE ) 300 MG/ML solution 100 mL (100 mLs Intravenous Contrast Given 08/06/24 2240)    Medical Decision Making Amount and/or Complexity of Data Reviewed Labs: ordered. Radiology: ordered.   This patient is a 78 year old female who presents to the ED for concern of hypertensive urgency.  With palpitations and additionally having some lower abdominal pain reported that she had some vaginal spotting last week.  Noted to have normal bowel movements and has had some nausea with no vomiting, currently not experiencing any nausea.  On physical exam, patient is in no acute distress, afebrile, alert and orient x 4, speaking in full sentences, nontachypneic, nontachycardic.  Notably has some mild lower abdominal tenderness to palpation with exam otherwise unremarkable.  LCTAB, RRR, no murmur, no CVA tenderness.  Patient declined GU exam despite alerting her that we would not be to fully investigate without visualization.  She preferred to self swab.  Lab work and imaging were unremarkable.  No acute findings were noted.  Blood pressure was decreased and patient was not showing any signs of hypertensive emergency at this time.  Will have her continue taking her blood which medication and following up with PCP for blood pressure management as well as to follow-up with patient's vaginal spotting, for possible referral to OB/GYN for further evaluation.  Provider should return ER precautions.  Patient vital signs have remained stable throughout the course of patient's time in the ED. Low suspicion for any other emergent pathology at this time. I believe this patient is safe to be discharged. Provided strict return to ER precautions. Patient expressed agreement and understanding of plan. All questions were answered.  Differential diagnoses prior to evaluation: The emergent differential diagnosis includes, but is not limited to,  diverticulitis, gastroenteritis, SBO, STI, UTI, nephrolithiasis, ACS, arrhythmia, AUB. This is not an exhaustive differential.   Past Medical History / Co-morbidities / Social History: Breast cancer, diabetes, seizures, HTN, HLD, anemia requiring iron transfusions  Additional history: Chart reviewed. Pertinent results include:   Last seen by behavioral health 11/12/125  Last seen by hematology on 11//2025 for iron deficiency anemia  Lab Tests/Imaging studies: I personally interpreted labs/imaging and the pertinent results include:   CBC notes an improved hemoglobin from previous BMP shows a mild hypocalcemia  but otherwise unremarkable Hepatic function panel unremarkable Lipase unremarkable UA unremarkable GC pending Wet prep unremarkable Troponin unremarkable  .CT abdomen pelvis does not show any acute abnormality Chest x-ray unremarkable   I agree with the radiologist interpretation.  Cardiac monitoring: EKG obtained and interpreted by myself and attending physician which shows: Sinus rhythm  EKG Interpretation Date/Time:  Friday August 06 2024 19:18:29 EST Ventricular Rate:  67 PR Interval:  142 QRS Duration:  96 QT Interval:  420 QTC Calculation: 444 R Axis:   4  Text Interpretation: Sinus rhythm Consider right atrial enlargement Low voltage, precordial leads Borderline T wave abnormalities No significant change since last tracing Confirmed by Ellouise Fine ([id number]) on 08/06/2024 9:12:18 PM          Medications:  I have reviewed the patients home medicines and have made adjustments as needed.  Critical Interventions: None  Social Determinants of Health: Has good follow-up with PCP  Disposition: After consideration of the diagnostic results and the patients response to treatment, I feel that the patient would benefit from discharge and treatment as above.   emergency department workup does not suggest an emergent condition requiring admission or immediate  intervention beyond what has been performed at this time. The plan is: Follow-up with PCP, return to the ER for new or worsening symptoms. The patient is safe for discharge and has been instructed to return immediately for worsening symptoms, change in symptoms or any other concerns.   Final diagnoses:  Lower abdominal pain  Hypertensive urgency  Palpitations    ED Discharge Orders     None          Beola Terrall RAMAN, PA-C 08/06/24 2337    Kingsley, Victoria K, DO 08/06/24 2340

## 2024-08-10 LAB — GC/CHLAMYDIA PROBE AMP (~~LOC~~) NOT AT ARMC
Chlamydia: NEGATIVE
Comment: NEGATIVE
Comment: NORMAL
Neisseria Gonorrhea: NEGATIVE
